# Patient Record
Sex: Female | Born: 1940 | Race: White | Hispanic: No | Smoking: Current some day smoker
Health system: Southern US, Community
[De-identification: ages and names within clinical notes are randomized; demographics above are authoritative.]

## PROBLEM LIST (undated history)

## (undated) DIAGNOSIS — F32A Depression, unspecified: Secondary | ICD-10-CM

## (undated) DIAGNOSIS — D649 Anemia, unspecified: Secondary | ICD-10-CM

## (undated) DIAGNOSIS — C55 Malignant neoplasm of uterus, part unspecified: Secondary | ICD-10-CM

## (undated) DIAGNOSIS — E119 Type 2 diabetes mellitus without complications: Secondary | ICD-10-CM

## (undated) DIAGNOSIS — E785 Hyperlipidemia, unspecified: Secondary | ICD-10-CM

## (undated) DIAGNOSIS — F329 Major depressive disorder, single episode, unspecified: Secondary | ICD-10-CM

## (undated) HISTORY — DX: Hyperlipidemia, unspecified: E78.5

## (undated) HISTORY — PX: ABDOMINAL HYSTERECTOMY: SHX81

## (undated) HISTORY — DX: Malignant neoplasm of uterus, part unspecified: C55

## (undated) HISTORY — PX: COLONOSCOPY: SHX174

## (undated) HISTORY — PX: APPENDECTOMY: SHX54

## (undated) HISTORY — DX: Type 2 diabetes mellitus without complications: E11.9

## (undated) HISTORY — DX: Anemia, unspecified: D64.9

## (undated) HISTORY — PX: GALLBLADDER SURGERY: SHX652

## (undated) HISTORY — DX: Depression, unspecified: F32.A

---
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
## 2019-01-30 ENCOUNTER — Other Ambulatory Visit: Payer: Self-pay

## 2019-01-30 ENCOUNTER — Emergency Department (HOSPITAL_COMMUNITY)
Admission: EM | Admit: 2019-01-30 | Discharge: 2019-01-30 | Disposition: A | Discharge status: Home / Self Care | Payer: Self-pay | Attending: Emergency Medicine | Admitting: Emergency Medicine

## 2019-01-30 ENCOUNTER — Observation Stay (HOSPITAL_COMMUNITY)
Admission: EM | Admit: 2019-01-30 | Discharge: 2019-01-31 | Disposition: A | Discharge status: Home / Self Care | Payer: Medicare Other | Attending: Internal Medicine | Admitting: Internal Medicine

## 2019-01-30 ENCOUNTER — Emergency Department (HOSPITAL_COMMUNITY): Payer: Medicare Other

## 2019-01-30 DIAGNOSIS — Z9114 Patient's other noncompliance with medication regimen: Secondary | ICD-10-CM | POA: Insufficient documentation

## 2019-01-30 DIAGNOSIS — Z20822 Contact with and (suspected) exposure to covid-19: Secondary | ICD-10-CM | POA: Diagnosis not present

## 2019-01-30 DIAGNOSIS — D649 Anemia, unspecified: Secondary | ICD-10-CM | POA: Diagnosis present

## 2019-01-30 DIAGNOSIS — D500 Iron deficiency anemia secondary to blood loss (chronic): Secondary | ICD-10-CM

## 2019-01-30 DIAGNOSIS — Z880 Allergy status to penicillin: Secondary | ICD-10-CM | POA: Diagnosis not present

## 2019-01-30 DIAGNOSIS — Z7982 Long term (current) use of aspirin: Secondary | ICD-10-CM | POA: Diagnosis not present

## 2019-01-30 DIAGNOSIS — I493 Ventricular premature depolarization: Secondary | ICD-10-CM | POA: Insufficient documentation

## 2019-01-30 DIAGNOSIS — Z7984 Long term (current) use of oral hypoglycemic drugs: Secondary | ICD-10-CM | POA: Diagnosis not present

## 2019-01-30 DIAGNOSIS — E1165 Type 2 diabetes mellitus with hyperglycemia: Secondary | ICD-10-CM | POA: Diagnosis not present

## 2019-01-30 DIAGNOSIS — R739 Hyperglycemia, unspecified: Secondary | ICD-10-CM | POA: Diagnosis present

## 2019-01-30 DIAGNOSIS — Z8542 Personal history of malignant neoplasm of other parts of uterus: Secondary | ICD-10-CM | POA: Diagnosis not present

## 2019-01-30 DIAGNOSIS — I498 Other specified cardiac arrhythmias: Secondary | ICD-10-CM | POA: Insufficient documentation

## 2019-01-30 DIAGNOSIS — F329 Major depressive disorder, single episode, unspecified: Secondary | ICD-10-CM | POA: Insufficient documentation

## 2019-01-30 DIAGNOSIS — D509 Iron deficiency anemia, unspecified: Principal | ICD-10-CM | POA: Insufficient documentation

## 2019-01-30 DIAGNOSIS — Z87891 Personal history of nicotine dependence: Secondary | ICD-10-CM | POA: Diagnosis not present

## 2019-01-30 DIAGNOSIS — R634 Abnormal weight loss: Secondary | ICD-10-CM

## 2019-01-30 DIAGNOSIS — Z5321 Procedure and treatment not carried out due to patient leaving prior to being seen by health care provider: Secondary | ICD-10-CM | POA: Insufficient documentation

## 2019-01-30 DIAGNOSIS — N179 Acute kidney failure, unspecified: Secondary | ICD-10-CM | POA: Diagnosis present

## 2019-01-30 LAB — GLUCOSE, CAPILLARY
Glucose-Capillary: 501 mg/dL (ref 70–99)
Glucose-Capillary: 251 mg/dL — ABNORMAL HIGH (ref 70–99)

## 2019-01-30 LAB — RETICULOCYTES
Immature Retic Fract: 23.5 % — ABNORMAL HIGH (ref 2.3–15.9)
RBC.: 3.09 MIL/uL — ABNORMAL LOW (ref 3.87–5.11)
Retic Count, Absolute: 98.9 10*3/uL (ref 19.0–186.0)
Retic Ct Pct: 3.2 % — ABNORMAL HIGH (ref 0.4–3.1)

## 2019-01-30 LAB — FERRITIN: Ferritin: 6 ng/mL — ABNORMAL LOW (ref 11–307)

## 2019-01-30 LAB — COMPREHENSIVE METABOLIC PANEL
ALT: 13 U/L (ref 0–44)
AST: 17 U/L (ref 15–41)
Albumin: 3.2 g/dL — ABNORMAL LOW (ref 3.5–5.0)
Alkaline Phosphatase: 63 U/L (ref 38–126)
Anion gap: 10 (ref 5–15)
BUN: 15 mg/dL (ref 8–23)
CO2: 23 mmol/L (ref 22–32)
Calcium: 9.2 mg/dL (ref 8.9–10.3)
Chloride: 100 mmol/L (ref 98–111)
Creatinine, Ser: 1.29 mg/dL — ABNORMAL HIGH (ref 0.44–1.00)
GFR calc Af Amer: 46 mL/min — ABNORMAL LOW (ref >60)
GFR calc non Af Amer: 40 mL/min — ABNORMAL LOW (ref >60)
Glucose, Bld: 421 mg/dL — ABNORMAL HIGH (ref 70–99)
Potassium: 4.6 mmol/L (ref 3.5–5.1)
Sodium: 133 mmol/L — ABNORMAL LOW (ref 135–145)
Total Bilirubin: 0.6 mg/dL (ref 0.3–1.2)
Total Protein: 6.2 g/dL — ABNORMAL LOW (ref 6.5–8.1)

## 2019-01-30 LAB — URINALYSIS, ROUTINE W REFLEX MICROSCOPIC
Bilirubin Urine: NEGATIVE
Glucose, UA: >=500 mg/dL — AB
Ketones, ur: 5 mg/dL — AB
Nitrite: NEGATIVE
Protein, ur: NEGATIVE mg/dL
Specific Gravity, Urine: 1.011 (ref 1.005–1.030)
pH: 5 (ref 5.0–8.0)

## 2019-01-30 LAB — PREPARE RBC (CROSSMATCH)

## 2019-01-30 LAB — HEMOGLOBIN A1C
Hgb A1c MFr Bld: 16.7 % — ABNORMAL HIGH (ref 4.8–5.6)
Mean Plasma Glucose: 432.59 mg/dL

## 2019-01-30 LAB — POC OCCULT BLOOD, ED: Fecal Occult Bld: NEGATIVE

## 2019-01-30 LAB — CBC
HCT: 26 % — ABNORMAL LOW (ref 36.0–46.0)
Hemoglobin: 7.2 g/dL — ABNORMAL LOW (ref 12.0–15.0)
MCH: 21.9 pg — ABNORMAL LOW (ref 26.0–34.0)
MCHC: 27.7 g/dL — ABNORMAL LOW (ref 30.0–36.0)
MCV: 79 fL — ABNORMAL LOW (ref 80.0–100.0)
Platelets: 287 10*3/uL (ref 150–400)
RBC: 3.29 MIL/uL — ABNORMAL LOW (ref 3.87–5.11)
RDW: 15.5 % (ref 11.5–15.5)
WBC: 7.5 10*3/uL (ref 4.0–10.5)
nRBC: 0 % (ref 0.0–0.2)

## 2019-01-30 LAB — FOLATE: Folate: 16.9 ng/mL (ref >5.9)

## 2019-01-30 LAB — CBG MONITORING, ED: Glucose-Capillary: 400 mg/dL — ABNORMAL HIGH (ref 70–99)

## 2019-01-30 LAB — ABO/RH: ABO/RH(D): O POS

## 2019-01-30 LAB — IRON AND TIBC
Iron: 19 ug/dL — ABNORMAL LOW (ref 28–170)
Saturation Ratios: 4 % — ABNORMAL LOW (ref 10.4–31.8)
TIBC: 430 ug/dL (ref 250–450)
UIBC: 411 ug/dL

## 2019-01-30 LAB — SARS CORONAVIRUS 2 (TAT 6-24 HRS): SARS Coronavirus 2: NEGATIVE

## 2019-01-30 LAB — VITAMIN B12: Vitamin B-12: 3244 pg/mL — ABNORMAL HIGH (ref 180–914)

## 2019-01-30 MED ORDER — LACTATED RINGERS IV BOLUS
1000.0000 mL | Freq: Once | INTRAVENOUS | Status: AC
  Administered 2019-01-30: 23:00:00 1000 mL via INTRAVENOUS

## 2019-01-30 MED ORDER — INSULIN ASPART 100 UNIT/ML ~~LOC~~ SOLN
0.0000 [IU] | Freq: Every day | SUBCUTANEOUS | Status: DC
  Administered 2019-01-30: 3 [IU] via SUBCUTANEOUS

## 2019-01-30 MED ORDER — INSULIN ASPART 100 UNIT/ML ~~LOC~~ SOLN: 18.0000 [IU] | Freq: Once | SUBCUTANEOUS | Status: DC

## 2019-01-30 MED ORDER — INSULIN ASPART 100 UNIT/ML ~~LOC~~ SOLN: 2.0000 [IU] | SUBCUTANEOUS | Status: DC

## 2019-01-30 MED ORDER — INSULIN ASPART 100 UNIT/ML ~~LOC~~ SOLN
0.0000 [IU] | Freq: Three times a day (TID) | SUBCUTANEOUS | Status: DC
  Administered 2019-01-30 – 2019-01-31 (×2): 15 [IU] via SUBCUTANEOUS
  Administered 2019-01-31: 3 [IU] via SUBCUTANEOUS

## 2019-01-30 MED ORDER — SULFAMETHOXAZOLE-TRIMETHOPRIM 800-160 MG PO TABS
1.0000 | ORAL_TABLET | Freq: Once | ORAL | Status: AC
  Administered 2019-01-30: 1 via ORAL
  Filled 2019-01-30: qty 1

## 2019-01-30 MED ORDER — INSULIN ASPART 100 UNIT/ML ~~LOC~~ SOLN
4.0000 [IU] | Freq: Once | SUBCUTANEOUS | Status: AC
  Administered 2019-01-30: 4 [IU] via INTRAVENOUS

## 2019-01-30 MED ORDER — SODIUM CHLORIDE 0.9 % IV BOLUS
500.0000 mL | Freq: Once | INTRAVENOUS | Status: AC
  Administered 2019-01-30: 500 mL via INTRAVENOUS

## 2019-01-30 MED ORDER — SODIUM CHLORIDE 0.9 % IV SOLN
10.0000 mL/h | Freq: Once | INTRAVENOUS | Status: AC
  Administered 2019-01-30: 10 mL/h via INTRAVENOUS

## 2019-01-30 MED ORDER — INSULIN GLARGINE 100 UNIT/ML ~~LOC~~ SOLN
12.0000 [IU] | Freq: Every day | SUBCUTANEOUS | Status: DC
  Administered 2019-01-30: 12 [IU] via SUBCUTANEOUS
  Filled 2019-01-30 (×3): qty 0.12

## 2019-01-30 MED ORDER — INSULIN ASPART 100 UNIT/ML ~~LOC~~ SOLN
3.0000 [IU] | Freq: Once | SUBCUTANEOUS | Status: AC
  Administered 2019-01-30: 3 [IU] via SUBCUTANEOUS

## 2019-01-30 NOTE — ED Notes (Signed)
Report given to 5N 

## 2019-01-30 NOTE — Consult Note (Addendum)
[facility] Consult: 4:42 PM 01/30/2019  LOS: 0 days    Referring Provider: Dr Rubye Oaks in ED  Primary Care Physician:  DR "Jenetta Downer" at [facility] in [place].   Primary Gastroenterologist:  unassigned     Reason for Consultation: Microcytic anemia.   HPI: Laura Winters is a 79 y.o. female.  PMH unknown.     Comes to the ED and there is no past medical history or meds.  Arrives with no paperwork..  Patient has had a laparoscopic cholecystectomy, abdominal hysterectomy.  She had colonoscopy in [place] approximately 10 years ago and does not recall having had polyps at the time.  For the last 7 years she has been living in [place] but in September moved to [place] is living in an assisted living facility..  Patient was sent by her family doctor to the hospital to receive blood transfusion.  She had just established care with PCP yesterday and had labs drawn with apparent Hb of 7.  Patient has never had blood transfusion and had a remote history of anemia.  She has had weakness, depression, diminished appetite and 23 pound weight loss in the last few months.  She quit smoking her half pack cigarettes/day about 3 weeks ago. Patient denies black stools, bloody stools.  She does periodically have eruptions of what sounds like sebaceous cysts in the perirectal area.  He denies nausea, abdominal or rectal pain.  Has not had any rectal bleeding or blood in her stools.  No melenic stools.  Uses daily low-dose aspirin, OTC ibuprofen maybe 6 times a month.  No alcohol. On DRE the stool is soft, yellow and FOBT negative  Hb 7.2, MCV 79.  Iron is low at 19, ferritin is 6. Sodium 133.  Glucose 421.  BUN/creatinine 15/1.2 consistent with stage III CKD. Portable chest x-ray unremarkable.  No past medical history on  file.  The histories are not reviewed yet. Please review them in the "History" navigator section and refresh this [place].  Prior to Admission medications   Not on File    Scheduled Meds: . insulin aspart  0-15 Units Subcutaneous TID WC  . insulin aspart  0-5 Units Subcutaneous QHS  . insulin glargine  12 Units Subcutaneous Daily   Infusions:  PRN Meds:    Allergies as of 01/30/2019  . (Not on File)    No family history on file.  Social History   Socioeconomic History  . Marital status: Widowed    Spouse name: Not on file  . Number of children: Not on file  . Years of education: Not on file  . Highest education level: Not on file  Occupational History  . Not on file  Tobacco Use  . Smoking status: Not on file  Substance and Sexual Activity  . Alcohol use: Not on file  . Drug use: Not on file  . Sexual activity: Not on file  Other Topics Concern  . Not on file  Social History Narrative  . Not on file   Social Determinants of Health   Financial Resource  Strain:   . Difficulty of Paying Living Expenses: Not on file  Food Insecurity:   . Worried About Charity fundraiser in the Last Year: Not on file  . Ran Out of Food in the Last Year: Not on file  Transportation Needs:   . Lack of Transportation (Medical): Not on file  . Lack of Transportation (Non-Medical): Not on file  Physical Activity:   . Days of Exercise per Week: Not on file  . Minutes of Exercise per Session: Not on file  Stress:   . Feeling of Stress : Not on file  Social Connections:   . Frequency of Communication with Friends and Family: Not on file  . Frequency of Social Gatherings with Friends and Family: Not on file  . Attends Religious Services: Not on file  . Active Member of Clubs or Organizations: Not on file  . Attends Archivist Meetings: Not on file  . Marital Status: Not on file  Intimate Partner Violence:   . Fear of Current or Ex-Partner: Not on file  . Emotionally  Abused: Not on file  . Physically Abused: Not on file  . Sexually Abused: Not on file    REVIEW OF SYSTEMS: Constitutional: Denies, fatigue. ENT:  No nose bleeds Pulm: Shortness of breath with moderate exertion. CV:  No angina.  Some tachycardia with moderate exertion.  No LE edema.  GU:  No hematuria, no frequency GI: See HPI. Heme: No unusual eating or bruising. Transfusions: No previous transfusions. Neuro:  No headaches, no peripheral tingling or numbness.  No syncope.  No seizures. Derm: Periodic "almond"-like eruptions on her rectum which drain a white cheesy substance. Endocrine:  No sweats or chills.  No polyuria or dysuria Immunization: Not queried. Travel: Moved to [place] from [place] 4 months ago.   PHYSICAL EXAM: Vital signs in last 24 hours: Vitals:   01/30/19 1530 01/30/19 1600  BP: 111/64   Pulse:  79  Resp: 14   Temp:  98 F (36.7 C)  SpO2:  98%   Wt Readings from Last 3 Encounters:  No data found for Wt    General: Patient is alert, pleasant, cooperative.  Does not look ill. Head: No facial asymmetry or swelling.  No signs of head trauma. Eyes: No scleral icterus.  No conjunctival pallor. Ears: Not hard of hearing Nose: No congestion or discharge. Mouth: Oropharynx moist, pink, clear.  There are some vascular blebs at the tip of her tongue.  Tongue is midline.  Fair dentition. Neck: No JVD, no masses, no thyromegaly. Lungs: Clear bilaterally.  No labored breathing.  No cough. Heart: RRR.  No MRG.  S1, S2 present. Abdomen: Soft.  Not tender, not distended.  She has a transverse upper hernia versus diastases of the muscle and a lower midline ventral hernia.  Well-healed surgical scars.  No masses, no HSM, no bruits, no hernias..   Rectal: Did not repeat rectal exam.  Rectal tone normal.  Hemorrhoidal skin tags.  Light yellowish-brown stool in vault.  Small furuncle versus early abscess at right gluteal cleft draining purulent material and associated  with mild induration but no fluctuance.  I saw some small pores without any drainage on visual inspection. Musc/Skeltl: No joint redness, swelling or gross deformity Extremities: No CCE. Neurologic: Oriented x3.  Moves all 4 limbs without gross weakness.  No tremors. Skin: No telangiectasia, suspicious lesions, sores or rashes. Tattoos: None observed Nodes: No cervical adenopathy. Psych: Calm, pleasant, cooperative.  Intake/Output from previous  day: No intake/output data recorded. Intake/Output this shift: Total I/O In: 500 [IV Piggyback:500] Out: -   LAB RESULTS: Recent Labs    01/30/19 1044  WBC 7.5  HGB 7.2*  HCT 26.0*  PLT 287   BMET Lab Results  Component Value Date   NA 133 (L) 01/30/2019   K 4.6 01/30/2019   CL 100 01/30/2019   CO2 23 01/30/2019   GLUCOSE 421 (H) 01/30/2019   BUN 15 01/30/2019   CREATININE 1.29 (H) 01/30/2019   CALCIUM 9.2 01/30/2019   LFT Recent Labs    01/30/19 1044  PROT 6.2*  ALBUMIN 3.2*  AST 17  ALT 13  ALKPHOS 63  BILITOT 0.6   PT/INR No results found for: INR, PROTIME Hepatitis Panel No results for input(s): HEPBSAG, HCVAB, HEPAIGM, HEPBIGM in the last 72 hours. C-Diff No components found for: CDIFF Lipase  No results found for: LIPASE  Drugs of Abuse  No results found for: LABOPIA, COCAINSCRNUR, LABBENZ, AMPHETMU, THCU, LABBARB   RADIOLOGY STUDIES: DG Chest Portable 1 View  Result Date: 01/30/2019 CLINICAL DATA:  Anemia and weakness. EXAM: PORTABLE CHEST 1 VIEW COMPARISON:  None. FINDINGS: The heart size and mediastinal contours are within normal limits. There is no evidence of pulmonary edema, consolidation, pneumothorax, nodule or pleural fluid. The visualized skeletal structures are unremarkable. IMPRESSION: No active disease. Electronically Signed   By: Aletta Edouard M.D.   On: 01/30/2019 12:59     IMPRESSION:   *    iron deficiency anemia.  FOBT negative.  Patient denies history of bleeding per rectum, melena  or history of ulcer disease. Remote, apparently unremarkable colonoscopy, no records found. Completed 1/2 PRBCs.    *    Covid 19 testing negative.     PLAN:     *    For Dr. [name].  I suspect she is going to end up having EGD, colonoscopy.   Azucena Freed  01/30/2019, 4:42 PM Phone [phone]  GI ATTENDING  History and data reviewed. Agree with assessment as outlined. Agree with transfusion for symptomatic iron deficiency anemia. Will need endoscopic workup at some point to be determined. No bleeding. Stool heme negative. Weight loss concerning. Will follow.   Docia Chuck. Geri Seminole., M.D. [facility] Division of Gastroenterology

## 2019-01-30 NOTE — ED Notes (Signed)
Pt entered into system incorrectly.

## 2019-01-30 NOTE — H&P (Signed)
Date: 01/30/2019               Patient Name:  Laura Winters MRN: [id number]  DOB: 02-11-1940 Age / Sex: 79 y.o., female   PCP: Patient, No Pcp Per         Medical Service: Internal Medicine Teaching Service         Attending Physician: Dr. Aldine Contes, MD    First Contact: Dr. Madilyn Fireman Pager: [phone]  Second Contact: Dr. Eileen Stanford Pager: [phone]       After Hours (After 5p/  First Contact Pager: [phone]  weekends / holidays): Second Contact Pager: [phone]   Chief Complaint: Fatigue, "I came for blood transfusion"  History of Present Illness: Laura Winters is a 79 yo F presenting after her Hgb was found to be 7 by her PCP in setting of fatigue and weakness for the last few months. She reports that she has been feeling "wobbly" and has no energy. She just wants to lay in bed and sleep. She reports improvement in her fatigue when she has activities to do like go to lunch with a friend. She recently moved in the area from [place] to be closer to family and it has been hard leaving friend behind and being in a retirement community that keeps people separated. Per her son, she established with a PCP because she ran out of her diabetes medications and upon doing so it was found that her glucose was incredibly elevated and her Hgb was low. The patient also endorses a 20 pound unintentional weight loss without any change in her appetite. She denies rhinorrhea, sore throat, chest pain, shortness of breath, palpitation, BRBPR, hematochezia and melena. She reports that she has had depression for years and cried in the emergency department because she wants to go home. She receives great care from her son who is making arrangements for her return home. She declined to speak with [place] further secondary to the pain from her IV.   Medical hx: DM Uterine cancer (around 30 years ago) treated with surgery Depression Reports a colonoscopy a few years ago that was normal Says she does not regularly get  mammograms Does not get low dose CT scans for lung cancer screening She declined to speak with [place] further secondary to the pain from her IV.   Meds:  No outpatient medications have been marked as taking for the 01/30/19 encounter [facility] Encounter).  Glipizide and metofrmin for diabet4s Incomplete medication hx obtained as patient declined to speak with [place] further  Allergies: Allergies as of 01/30/2019  . (Not on File)   No past medical history on file.  Family History:  No family history on file. Father had depression Mother was healthy Incomplete family medical hx obtained as patient declined to speak with [place] further  Social History:  Social History   Socioeconomic History  . Marital status: Widowed    Spouse name: Not on file  . Number of children: Not on file  . Years of education: Not on file  . Highest education level: Not on file  Occupational History  . Not on file  Tobacco Use  . Smoking status: Not on file  Substance and Sexual Activity  . Alcohol use: Not on file  . Drug use: Not on file  . Sexual activity: Not on file  Other Topics Concern  . Not on file  Social History Narrative  . Not on file   Social Determinants of Health   Financial Resource  Strain:   . Difficulty of Paying Living Expenses: Not on file  Food Insecurity:   . Worried About Charity fundraiser in the Last Year: Not on file  . Ran Out of Food in the Last Year: Not on file  Transportation Needs:   . Lack of Transportation (Medical): Not on file  . Lack of Transportation (Non-Medical): Not on file  Physical Activity:   . Days of Exercise per Week: Not on file  . Minutes of Exercise per Session: Not on file  Stress:   . Feeling of Stress : Not on file  Social Connections:   . Frequency of Communication with Friends and Family: Not on file  . Frequency of Social Gatherings with Friends and Family: Not on file  . Attends Religious Services: Not on file  . Active Member of Clubs or  Organizations: Not on file  . Attends Archivist Meetings: Not on file  . Marital Status: Not on file  Intimate Partner Violence:   . Fear of Current or Ex-Partner: Not on file  . Emotionally Abused: Not on file  . Physically Abused: Not on file  . Sexually Abused: Not on file  Patient has 60 pack year hx and reports quitting a few weeks ago She occasionally drinks sangria Incomplete social hx obtained as patient declined to speak with [place] further.   Review of Systems: A complete ROS was negative except as per HPI.   Physical Exam: Blood pressure 111/64, pulse 79, temperature 98 F (36.7 C), temperature source Oral, resp. rate 14, SpO2 98 %.  Physical Exam  Constitutional: She is well-developed, well-nourished, and in no distress. No distress.  HENT:  Head: Normocephalic and atraumatic.  Cardiovascular: Regular rhythm and normal heart sounds.  Pulmonary/Chest: Effort normal and breath sounds normal. No respiratory distress.  Abdominal: Soft. Bowel sounds are normal. She exhibits no distension.  Musculoskeletal:        General: Normal range of motion.     Cervical back: Normal range of motion.  Neurological: She is alert.  Skin: Skin is warm and dry. She is not diaphoretic.  Psychiatric:  irritable  Nursing note and vitals reviewed.  Labs:  Results for orders placed or performed during the hospital encounter of 01/30/19 (from the past 24 hour(s))  Type and screen [place]     Status: None (Preliminary result)   Collection Time: 01/30/19 10:35 AM  Result Value Ref Range   ABO/RH(D) O POS    Antibody Screen NEG    Sample Expiration 02/02/2019,2359    Unit Number [id number]    Blood Component Type RED CELLS,LR    Unit division 00    Status of Unit ALLOCATED    Transfusion Status OK TO TRANSFUSE    Crossmatch Result Compatible    Unit Number [id number]    Blood Component Type RED CELLS,LR    Unit division 00    Status of Unit ISSUED      Transfusion Status OK TO TRANSFUSE    Crossmatch Result      Compatible Performed at [facility], [address], [place], [place] [address]   ABO/Rh     Status: None   Collection Time: 01/30/19 10:35 AM  Result Value Ref Range   ABO/RH(D)      O POS Performed at [facility] [address], [place], [place] [address]   Prepare RBC     Status: None   Collection Time: 01/30/19 10:35  AM  Result Value Ref Range   Order Confirmation      ORDER PROCESSED BY BLOOD BANK Performed at [facility], [place] [address], [place], [place] [address]   Comprehensive metabolic panel     Status: Abnormal   Collection Time: 01/30/19 10:44 AM  Result Value Ref Range   Sodium 133 (L) 135 - 145 mmol/L   Potassium 4.6 3.5 - 5.1 mmol/L   Chloride 100 98 - 111 mmol/L   CO2 23 22 - 32 mmol/L   Glucose, Bld 421 (H) 70 - 99 mg/dL   BUN 15 8 - 23 mg/dL   Creatinine, Ser 1.29 (H) 0.44 - 1.00 mg/dL   Calcium 9.2 8.9 - 10.3 mg/dL   Total Protein 6.2 (L) 6.5 - 8.1 g/dL   Albumin 3.2 (L) 3.5 - 5.0 g/dL   AST 17 15 - 41 U/L   ALT 13 0 - 44 U/L   Alkaline Phosphatase 63 38 - 126 U/L   Total Bilirubin 0.6 0.3 - 1.2 mg/dL   GFR calc non Af Amer 40 (L) >60 mL/min   GFR calc Af Amer 46 (L) >60 mL/min   Anion gap 10 5 - 15  CBC     Status: Abnormal   Collection Time: 01/30/19 10:44 AM  Result Value Ref Range   WBC 7.5 4.0 - 10.5 K/uL   RBC 3.29 (L) 3.87 - 5.11 MIL/uL   Hemoglobin 7.2 (L) 12.0 - 15.0 g/dL   HCT 26.0 (L) 36.0 - 46.0 %   MCV 79.0 (L) 80.0 - 100.0 fL   MCH 21.9 (L) 26.0 - 34.0 pg   MCHC 27.7 (L) 30.0 - 36.0 g/dL   RDW 15.5 11.5 - 15.5 %   Platelets 287 150 - 400 K/uL   nRBC 0.0 0.0 - 0.2 %  POC occult blood, ED     Status: None   Collection Time: 01/30/19 12:00 PM  Result Value Ref Range   Fecal Occult Bld NEGATIVE NEGATIVE  SARS CORONAVIRUS 2 (TAT 6-24 HRS) Nasopharyngeal Nasopharyngeal Swab     Status: None   Collection Time: 01/30/19 12:42 PM   Specimen:  Nasopharyngeal Swab  Result Value Ref Range   SARS Coronavirus 2 NEGATIVE NEGATIVE  Vitamin B12     Status: Abnormal   Collection Time: 01/30/19 12:42 PM  Result Value Ref Range   Vitamin B-12 3,244 (H) 180 - 914 pg/mL  Iron and TIBC     Status: Abnormal   Collection Time: 01/30/19 12:42 PM  Result Value Ref Range   Iron 19 (L) 28 - 170 ug/dL   TIBC 430 250 - 450 ug/dL   Saturation Ratios 4 (L) 10.4 - 31.8 %   UIBC 411 ug/dL  Ferritin     Status: Abnormal   Collection Time: 01/30/19 12:42 PM  Result Value Ref Range   Ferritin 6 (L) 11 - 307 ng/mL  Reticulocytes     Status: Abnormal   Collection Time: 01/30/19 12:42 PM  Result Value Ref Range   Retic Ct Pct 3.2 (H) 0.4 - 3.1 %   RBC. 3.09 (L) 3.87 - 5.11 MIL/uL   Retic Count, Absolute 98.9 19.0 - 186.0 K/uL   Immature Retic Fract 23.5 (H) 2.3 - 15.9 %  Folate     Status: None   Collection Time: 01/30/19 12:42 PM  Result Value Ref Range   Folate 16.9 >5.9 ng/mL  Hemoglobin A1c     Status: Abnormal   Collection Time: 01/30/19 12:42  PM  Result Value Ref Range   Hgb A1c MFr Bld 16.7 (H) 4.8 - 5.6 %   Mean Plasma Glucose 432.59 mg/dL  CBG monitoring, ED     Status: Abnormal   Collection Time: 01/30/19  1:12 PM  Result Value Ref Range   Glucose-Capillary 400 (H) 70 - 99 mg/dL  Urinalysis, Routine w reflex microscopic     Status: Abnormal   Collection Time: 01/30/19  4:12 PM  Result Value Ref Range   Color, Urine STRAW (A) YELLOW   APPearance HAZY (A) CLEAR   Specific Gravity, Urine 1.011 1.005 - 1.030   pH 5.0 5.0 - 8.0   Glucose, UA >=500 (A) NEGATIVE mg/dL   Hgb urine dipstick SMALL (A) NEGATIVE   Bilirubin Urine NEGATIVE NEGATIVE   Ketones, ur 5 (A) NEGATIVE mg/dL   Protein, ur NEGATIVE NEGATIVE mg/dL   Nitrite NEGATIVE NEGATIVE   Leukocytes,Ua LARGE (A) NEGATIVE   RBC / HPF 0-5 0 - 5 RBC/hpf   WBC, UA 6-10 0 - 5 WBC/hpf   Bacteria, UA RARE (A) NONE SEEN   Squamous Epithelial / LPF 0-5 0 - 5   Mucus PRESENT      EKG: personally reviewed my interpretation is sinus tach.  CXR: personally reviewed my interpretation is no acute cardiopulmonary abnormality.  DG Chest Portable 1 View  Result Date: 01/30/2019 CLINICAL DATA:  Anemia and weakness. EXAM: PORTABLE CHEST 1 VIEW COMPARISON:  None. FINDINGS: The heart size and mediastinal contours are within normal limits. There is no evidence of pulmonary edema, consolidation, pneumothorax, nodule or pleural fluid. The visualized skeletal structures are unremarkable. IMPRESSION: No active disease. Electronically Signed   By: Aletta Edouard M.D.   On: 01/30/2019 12:59    Assessment:  Ms. Calender is a 79 yo F w/ a PMHx significant for DM2, depression and a distant uterine cancer s/p TAH presenting with fatigue, weakness, ?rectal bleeding and a 20 lb weight loss who was recently found to have a glucose in the 600s and a Hgb of 7 at her PCP's office and was told to come to the ED for a blood transfusion.   Plan by Problem:   Fatigue/Weakness/Symptomatic Anemia: -pt with a PMHx significant for DM2, depression and a distant uterine cancer s/p TAH presenting with 2-3 months of worsening fatigue and weakness with associated 20 lb weight loss who was found to have a glucose of 600 and a Hgb of 7 at her PCP's office -patient was treated with insulin and fluids and glucose came down but patient was told to present to the ED for blood transfusion  -patient's son says patient has been complaining of BRBPR and rectal pain although patient denies this in HPI -pt Hgb is 7.2 in the ED and iron studies show iron deficiency -POC occult blood negative -rectal exam by ED physician without obvious blood -pt reports a hx of a colonoscopy a few years ago which was normal; son doubts patient's reliability as a historian  -sx could be symptomatic anemia vs secondary to her uncontrolled diabetes or her chronic depression  -given the red flag sx of weight loss, consulting GI for concern  about a colorectal cancer -pt getting blood transfusion x 1  Plan: -fu GI recs -fu post transfusion CBC -treat diabetes as below -treat depression as below -obtain outside records  Uncontrolled Diabetes: -pt with hx of diabetes on glipizide and metformin -has had recent fatigue, weakness and 20 lb weight loss -recently found to have glucose in the  600s by her new PCP which is likely not acute given her lack of acute sx and hx per son regarding not regularly taking her diabetes medications -Hgb A1c here > 16 -received 4 U novolog in the ED  Plan: -obtain outside records -starting 12 units long acting today w/ SSI -carb modified diet  Depression: -pt with hx of depression per son -he states she is not always adherent to her depression medication regimen -no meds reported by patient  Plan: -obtain outside records -fu with son regarding patient's medications and restart patient on home depression meds as appropriate   AKI: -pt with no reported hx of CKD with admission Creatinine of 1.29 -no records in Epic or records in Care Everywhere -patient does have uncontrolled DM   Plan: -monitor with daily BMP -obtain outside records  Active Problems:   Symptomatic anemia  Dispo: Admit patient to Observation with expected length of stay less than 2 midnights.  Signed: Al Decant, MD 01/30/2019, 4:58 PM  Pager: [phone]

## 2019-01-30 NOTE — ED Notes (Signed)
Advised RN on 5N that pt has been given one unit PRBC and needs the second.  ALso advised that COVID is pending (pt has no symptoms)

## 2019-01-30 NOTE — ED Notes (Signed)
Carb modified tray ordered 

## 2019-01-30 NOTE — Progress Notes (Signed)
CRITICAL VALUE ALERT  Critical Value:  cbg 501  Date & Time Notied:  01/30/2019 at 1830  Provider Notified: Dareen Piano, nischal  Orders Received/Actions taken: stat cbc diff, given maximum coverage of insulin.

## 2019-01-30 NOTE — ED Notes (Signed)
Helped pt to bathroom where she voided and had a BM

## 2019-01-30 NOTE — ED Provider Notes (Signed)
[facility] EMERGENCY DEPARTMENT Provider Note   CSN: [id number] Arrival date & time: 01/30/19  1016     History Chief Complaint  Patient presents with   low hgb    Laura Winters is a 79 y.o. female with past medical history who presents for evaluation of low hemoglobin.  Patient states he recently moved here from [place] to be closer to family.  She lives in independent assisted living facility.  Patient states she has had generalized fatigue and weakness over the last week.  Went to establish care at a PCP office yesterday, [facility] and had labs drawn.  They called today with hemoglobin of 7.0.  Patient unsure if she has history of anemia.  She denies fever, chills, nausea, vomiting, chest pain, shortness of breath, abdominal pain, diarrhea, dysuria, last bowel movement 2 days ago, melena, bright red blood per rectum.  Patient states she does have a "pimple" on her buttocks.  Patient states she has had these previously which she normally "pops" and they resolved.  Rates her current pain a 1/10.  Has never had the surgically drained.  Denies additional aggravating or alleviating factors.   History obtained from patient past medical records.  No interpreter is used.  HPI        PCP- [facility]  Son murry citizen Contact    No past medical history on file.  There are no problems to display for this patient.  History obtained  OB History   No obstetric history on file.     No family history on file.  Social History   Tobacco Use   Smoking status: Not on file  Substance Use Topics   Alcohol use: Not on file   Drug use: Not on file    Home Medications Prior to Admission medications   Not on File    Allergies    Patient has no allergy information on record.  Review of Systems   Review of Systems  Constitutional: Positive for fatigue. Negative for activity change, appetite change, chills, diaphoresis, fever and unexpected weight  change.  HENT: Negative.   Respiratory: Negative.   Cardiovascular: Negative.   Gastrointestinal: Negative.   Genitourinary: Negative.   Musculoskeletal: Negative.   Skin: Negative.   Neurological: Positive for weakness (Generalized). Negative for dizziness, tremors, seizures, syncope, facial asymmetry, speech difficulty, light-headedness, numbness and headaches.  All other systems reviewed and are negative.   Physical Exam Updated Vital Signs BP (!) 148/84    Pulse (!) 126    Temp 98.8 F (37.1 C) (Oral)    Resp 18    SpO2 98%   Physical Exam Vitals and nursing note reviewed. Exam conducted with a chaperone present.  Constitutional:      General: She is not in acute distress.    Appearance: She is well-developed. She is not ill-appearing or toxic-appearing.     Comments: Thin, pleasant 79 year old  HENT:     Head: Normocephalic and atraumatic.     Jaw: There is normal jaw occlusion.     Mouth/Throat:     Lips: Pink.     Mouth: Mucous membranes are moist.  Eyes:     Pupils: Pupils are equal, round, and reactive to light.     Comments: EOM intact. Mild pallor to conjunctiva  Cardiovascular:     Rate and Rhythm: Normal rate.     Pulses: Normal pulses.     Heart sounds: Normal heart sounds.  Pulmonary:  Effort: Pulmonary effort is normal. No respiratory distress.     Breath sounds: Normal breath sounds and air entry.  Abdominal:     General: Bowel sounds are normal. There is no distension.     Palpations: There is no mass.     Tenderness: There is no abdominal tenderness. There is no right CVA tenderness, left CVA tenderness, guarding or rebound.     Hernia: No hernia is present.  Genitourinary:    Comments: Female tech in room during exam. Normal rectal tone. Old hemorrhoid skin tags. Light brown stool in vault. Small furuncle vs early abscess to right gluteal cleft. Does not extend into the rectum, rectal sphincter. Area draining prulet drainage. No surrounding  erythema, warmth, Mild induration without fluctuance Musculoskeletal:        General: Normal range of motion.     Cervical back: Normal range of motion.     Comments: Moves all 4 extremities without difficulty.  Skin:    General: Skin is warm and dry.  Neurological:     Mental Status: She is alert.     ED Results / Procedures / Treatments   Labs (all labs ordered are listed, but only abnormal results are displayed) Labs Reviewed  COMPREHENSIVE METABOLIC PANEL - Abnormal; Notable for the following components:      Result Value   Sodium 133 (*)    Glucose, Bld 421 (*)    Creatinine, Ser 1.29 (*)    Total Protein 6.2 (*)    Albumin 3.2 (*)    GFR calc non Af Amer 40 (*)    GFR calc Af Amer 46 (*)    All other components within normal limits  CBC - Abnormal; Notable for the following components:   RBC 3.29 (*)    Hemoglobin 7.2 (*)    HCT 26.0 (*)    MCV 79.0 (*)    MCH 21.9 (*)    MCHC 27.7 (*)    All other components within normal limits  SARS CORONAVIRUS 2 (TAT 6-24 HRS)  URINALYSIS, ROUTINE W REFLEX MICROSCOPIC  VITAMIN B12  FOLATE  IRON AND TIBC  FERRITIN  RETICULOCYTES  POC OCCULT BLOOD, ED  TYPE AND SCREEN  ABO/RH  PREPARE RBC (CROSSMATCH)    EKG None  Radiology No results found.  Procedures .Critical Care Performed by: Nettie Elm, PA-C Authorized by: Nettie Elm, PA-C   Critical care provider statement:    Critical care time (minutes):  45   Critical care was necessary to treat or prevent imminent or life-threatening deterioration of the following conditions:  Circulatory failure (Symptomatic anemia, AKI, hyperglycemia)   Critical care was time spent personally by me on the following activities:  Discussions with consultants, evaluation of patient's response to treatment, examination of patient, ordering and performing treatments and interventions, ordering and review of laboratory studies, ordering and review of radiographic studies,  pulse oximetry, re-evaluation of patient's condition, obtaining history from patient or surrogate and review of old charts   (including critical care time)  Medications Ordered in ED Medications  0.9 %  sodium chloride infusion (has no administration in time range)  insulin aspart (novoLOG) injection 2-6 Units (has no administration in time range)  sulfamethoxazole-trimethoprim (BACTRIM DS) 800-160 MG per tablet 1 tablet (1 tablet Oral Given 01/30/19 1229)  sodium chloride 0.9 % bolus 500 mL (500 mLs Intravenous New Bag/Given 01/30/19 1248)    ED Course  I have reviewed the triage vital signs and the nursing notes.  Pertinent  labs & imaging results that were available during my care of the patient were reviewed by me and considered in my medical decision making (see chart for details).  79 year old female presents for evaluation of low hemoglobin.  Patiently recently moved here from [place].  Went to establish care yesterday with a PCP.  Patient states she has had generalized fatigue and weakness x1 week.  Call today due to anemia.  Hemoglobin 7.0 at PCP office.  Patient denies any upper respiratory symptoms, chest pain, shortness of breath, hemoptysis, melena or bright red blood per rectum.  Heart and lungs clear.  Abdomen soft.  Light brown stool in rectal vault.  Occult negative.  Does have a small draining furuncle to right gluteal cleft.  No surrounding cellulitis.  We will give her Bactrim.  Area does not seem to track into rectum or anal sphincter.  Do not think we need to obtain CT abdomen at this time.  Have some pallor to her conjunctive a.  Unsure if she has prior history of anemia.  Labs obtained from triage.  Clinical Course as of Jan 30 1256  Fri Jan 30, 2019  1215 Hyponatremia at 133, hyperglycemia 421, patient has been out of her glipizide and Metformin x1 week.  Creatinine 1.29, no previous to compare.  Comprehensive metabolic panel(!) [BH]  99991111 Negative  POC occult blood, ED  [BH]  1230 No leukocytosis, Hgb 7.2, no prior to compare   CBC(!) [BH]    Clinical Course User Index [BH] Aastha Dayley A, PA-C   Will add on anemia panel urinalysis however patient denies any urinary complaints.  No known Covid exposures.  Patient will need admission for symptomatic anemia.  Hemoglobin 7.2 here.  No active bleeding on exam and she is not on any anticoagulation.  Discussedrisk versus benefit of blood transfusion.  Patient is agreeable.  Patient noted to be hyperglycemic on labs, she has been on for home glipizide, Metformin.  She is supposed to start insulin outpatient however she has not started this.  Hyperglycemia order set ordered with sliding scale. No evidence of DKA at this tim, normal gap. Will also give small bolus of fluids given she does have a creatinine of 1.29 and unsure of her baseline.  1257: CONSULT with Internal Medicine teaching service Resident who agrees to evaluate patient for admission, under attending Dr. Dareen Piano   Patient discussed with attending Dr. Rogene Houston who agrees with above treatment, plan and disposition. MDM Rules/Calculators/A&P                       Final Clinical Impression(s) / ED Diagnoses Final diagnoses:  Symptomatic anemia  Hyperglycemia  AKI (acute kidney injury) [facility])    Rx / DC Orders ED Discharge Orders    None       Erling Arrazola A, PA-C 01/30/19 1523    Fredia Sorrow, MD 02/01/19 [id number]

## 2019-01-30 NOTE — Progress Notes (Signed)
Pt new admit form ED alert and oriented, ambulatory, room air, covid collected but pending result, given 1 unit of prbc in ED and I'm giving 2nd unit of prbc verified by another RN with consent, will continue to monitor blood transfusion reaction.

## 2019-01-30 NOTE — Plan of Care (Signed)
Pt new admit Dx anemia 2nd unit of PRBC on going

## 2019-01-30 NOTE — ED Triage Notes (Signed)
Sent to ED for eval of hgb 7.0. Sent from pcp's office. Labs drawn there yesterday due to continued weakness. Pt denies pain, sob, or bleeding. Pt appears in nad.

## 2019-01-31 DIAGNOSIS — Z9889 Other specified postprocedural states: Secondary | ICD-10-CM

## 2019-01-31 DIAGNOSIS — N179 Acute kidney failure, unspecified: Secondary | ICD-10-CM

## 2019-01-31 DIAGNOSIS — R634 Abnormal weight loss: Secondary | ICD-10-CM | POA: Diagnosis not present

## 2019-01-31 DIAGNOSIS — R739 Hyperglycemia, unspecified: Secondary | ICD-10-CM

## 2019-01-31 DIAGNOSIS — E118 Type 2 diabetes mellitus with unspecified complications: Secondary | ICD-10-CM | POA: Diagnosis not present

## 2019-01-31 DIAGNOSIS — Z6826 Body mass index (BMI) 26.0-26.9, adult: Secondary | ICD-10-CM

## 2019-01-31 DIAGNOSIS — Z880 Allergy status to penicillin: Secondary | ICD-10-CM

## 2019-01-31 DIAGNOSIS — Z90710 Acquired absence of both cervix and uterus: Secondary | ICD-10-CM

## 2019-01-31 DIAGNOSIS — F329 Major depressive disorder, single episode, unspecified: Secondary | ICD-10-CM

## 2019-01-31 DIAGNOSIS — Z8542 Personal history of malignant neoplasm of other parts of uterus: Secondary | ICD-10-CM

## 2019-01-31 DIAGNOSIS — D508 Other iron deficiency anemias: Secondary | ICD-10-CM

## 2019-01-31 DIAGNOSIS — R7989 Other specified abnormal findings of blood chemistry: Secondary | ICD-10-CM | POA: Diagnosis not present

## 2019-01-31 DIAGNOSIS — Z7984 Long term (current) use of oral hypoglycemic drugs: Secondary | ICD-10-CM

## 2019-01-31 DIAGNOSIS — D500 Iron deficiency anemia secondary to blood loss (chronic): Secondary | ICD-10-CM | POA: Diagnosis not present

## 2019-01-31 LAB — CBC WITH DIFFERENTIAL/PLATELET
Abs Immature Granulocytes: 0.02 10*3/uL (ref 0.00–0.07)
Basophils Absolute: 0 10*3/uL (ref 0.0–0.1)
Basophils Relative: 1 %
Eosinophils Absolute: 0.2 10*3/uL (ref 0.0–0.5)
Eosinophils Relative: 3 %
HCT: 30.1 % — ABNORMAL LOW (ref 36.0–46.0)
Hemoglobin: 9.2 g/dL — ABNORMAL LOW (ref 12.0–15.0)
Immature Granulocytes: 0 %
Lymphocytes Relative: 24 %
Lymphs Abs: 1.4 10*3/uL (ref 0.7–4.0)
MCH: 24.4 pg — ABNORMAL LOW (ref 26.0–34.0)
MCHC: 30.6 g/dL (ref 30.0–36.0)
MCV: 79.8 fL — ABNORMAL LOW (ref 80.0–100.0)
Monocytes Absolute: 0.4 10*3/uL (ref 0.1–1.0)
Monocytes Relative: 7 %
Neutro Abs: 3.8 10*3/uL (ref 1.7–7.7)
Neutrophils Relative %: 65 %
Platelets: 190 10*3/uL (ref 150–400)
RBC: 3.77 MIL/uL — ABNORMAL LOW (ref 3.87–5.11)
RDW: 15.4 % (ref 11.5–15.5)
WBC: 5.9 10*3/uL (ref 4.0–10.5)
nRBC: 0 % (ref 0.0–0.2)

## 2019-01-31 LAB — BASIC METABOLIC PANEL
Anion gap: 8 (ref 5–15)
BUN: 19 mg/dL (ref 8–23)
CO2: 21 mmol/L — ABNORMAL LOW (ref 22–32)
Calcium: 8.5 mg/dL — ABNORMAL LOW (ref 8.9–10.3)
Chloride: 102 mmol/L (ref 98–111)
Creatinine, Ser: 1.66 mg/dL — ABNORMAL HIGH (ref 0.44–1.00)
GFR calc Af Amer: 34 mL/min — ABNORMAL LOW (ref >60)
GFR calc non Af Amer: 29 mL/min — ABNORMAL LOW (ref >60)
Glucose, Bld: 181 mg/dL — ABNORMAL HIGH (ref 70–99)
Potassium: 4.5 mmol/L (ref 3.5–5.1)
Sodium: 131 mmol/L — ABNORMAL LOW (ref 135–145)

## 2019-01-31 LAB — GLUCOSE, CAPILLARY
Glucose-Capillary: 369 mg/dL — ABNORMAL HIGH (ref 70–99)
Glucose-Capillary: 170 mg/dL — ABNORMAL HIGH (ref 70–99)

## 2019-01-31 MED ORDER — ACETAMINOPHEN 325 MG PO TABS
650.0000 mg | ORAL_TABLET | Freq: Four times a day (QID) | ORAL | Status: DC | PRN
  Administered 2019-01-31: 650 mg via ORAL
  Filled 2019-01-31: qty 2

## 2019-01-31 MED ORDER — FERROUS SULFATE 325 (65 FE) MG PO TABS: 325.0000 mg | ORAL_TABLET | Freq: Every day | ORAL | 3 refills | Status: AC

## 2019-01-31 MED ORDER — SODIUM CHLORIDE 0.9 % IV SOLN: INTRAVENOUS | Status: DC

## 2019-01-31 NOTE — Plan of Care (Signed)
  Problem: Pain Managment: Goal: General experience of comfort will improve Outcome: Progressing   Problem: Safety: Goal: Ability to remain free from injury will improve Outcome: Progressing   Problem: Skin Integrity: Goal: Risk for impaired skin integrity will decrease Outcome: Progressing   

## 2019-01-31 NOTE — Plan of Care (Signed)
Pt to d/c home all d/c instructions given and explained with understanding

## 2019-01-31 NOTE — Discharge Summary (Signed)
Name: Laura Winters MRN: [id number] DOB: 05/08/1940 79 y.o. PCP: Patient, No Pcp Per  Date of Admission: 01/30/2019 10:19 AM Date of Discharge: 01/31/2019 Attending Physician: No att. providers found  Discharge Diagnosis:  1. Iron deficiency anemia   2. Uncontrolled diabetes  Discharge Medications: Allergies as of 01/31/2019      Reactions   Penicillins Swelling, Rash   Did it involve swelling of the face/tongue/throat, SOB, or low BP? Yes Did it involve sudden or severe rash/hives, skin peeling, or any reaction on the inside of your mouth or nose? Yes Did you need to seek medical attention at a hospital or doctor's office? Yes When did it last happen?Childhood If all above answers are "NO", may proceed with cephalosporin use.      Medication List    TAKE these medications   aspirin EC 81 MG tablet Take 81 mg by mouth daily.   ferrous sulfate 325 (65 FE) MG tablet Take 1 tablet (325 mg total) by mouth daily.   glipiZIDE 10 MG tablet Commonly known as: GLUCOTROL Take 20 mg by mouth 2 (two) times daily.   lisinopril 20 MG tablet Commonly known as: ZESTRIL Take 20 mg by mouth daily.   metFORMIN 1000 MG tablet Commonly known as: GLUCOPHAGE Take 1,000 mg by mouth 2 (two) times daily.   sertraline 25 MG tablet Commonly known as: ZOLOFT Take 25 mg by mouth daily.   simvastatin 20 MG tablet Commonly known as: ZOCOR Take 20 mg by mouth every evening.   VITAMIN B12 PO Take 2 tablets by mouth daily.   VITAMIN D3 PO Take 1 tablet by mouth daily.       Disposition and follow-up:   [name] was discharged from [facility] in Good condition.  At the hospital follow up visit please address:   1.  F/u with GI for outpatient endoscopy/colonoscopy  2.  Repeat CBC with PCP   3.  Discuss starting insulin with PCP  Follow-up Appointments: Follow-up Information    [facility]. Schedule an appointment as soon as possible for  a visit in 1 week(s).   Contact information: Phone: [phone] Email: [email or web address]  [place], [place] [address]       Gastroenterology, [facility] Follow up in 1 week(s).   Contact information: [place] [address] [phone]           Gatroenterology  PCP  Hospital Course by problem list:  1. Fatigue/Weakness/Symptomatic Anemia: Patientpresenting with2-3 months of worsening fatigue and weakness with associated 20 lb weight loss who was found to have a Hgb of 7 at her PCP's office on 1/4. She was subsquently instructed to present to the ED where she received iron studies that reflected iron deficiency. POC occult blood was negative. She was treated with 2 units of pRBC's which improved her hemoglobin to 9.2. Of note, patent reports history of colonoscopy 10 years ago which was negative for polyps although son contests that this may not be reliable information. Patient to follow-up with GI for outpatient endoscopy/colonoscopy to identify possible source of bleeding.   2. Uncontrolled Diabetes: Patient with known history of diabetes on glipizide and metformin. Found to have blood sugar over 600 by PCP on 1/4. Likely not acute given her lack of acute symptoms and history per son regarding not regularly taking her diabetes medications. Hgb A1c = 16.7%. Blood sugars maintained with SSI as inpatient. Recommend starting insulin with PCP.   Discharge  Vitals:   BP 124/68 (BP Location: Left Arm)   Pulse 77   Temp 98.9 F (37.2 C) (Oral)   Resp 16   Ht 5' (1.524 m)   Wt 61.6 kg   SpO2 96%   BMI 26.52 kg/m   Pertinent Labs, Studies, and Procedures:   CBC Latest Ref Rng & Units 01/31/2019 01/30/2019  WBC 4.0 - 10.5 K/uL 5.9 7.5  Hemoglobin 12.0 - 15.0 g/dL 9.2(L) 7.2(L)  Hematocrit 36.0 - 46.0 % 30.1(L) 26.0(L)  Platelets 150 - 400 K/uL 190 287   CMP Latest Ref Rng & Units 01/31/2019 01/30/2019  Glucose 70 - 99 mg/dL  181(H) 421(H)  BUN 8 - 23 mg/dL 19 15  Creatinine 0.44 - 1.00 mg/dL 1.66(H) 1.29(H)  Sodium 135 - 145 mmol/L 131(L) 133(L)  Potassium 3.5 - 5.1 mmol/L 4.5 4.6  Chloride 98 - 111 mmol/L 102 100  CO2 22 - 32 mmol/L 21(L) 23  Calcium 8.9 - 10.3 mg/dL 8.5(L) 9.2  Total Protein 6.5 - 8.1 g/dL - 6.2(L)  Total Bilirubin 0.3 - 1.2 mg/dL - 0.6  Alkaline Phos 38 - 126 U/L - 63  AST 15 - 41 U/L - 17  ALT 0 - 44 U/L - 13   Iron/TIBC/Ferritin/ %Sat    Component Value Date/Time   IRON 19 (L) 01/30/2019 1242   TIBC 430 01/30/2019 1242   FERRITIN 6 (L) 01/30/2019 1242   IRONPCTSAT 4 (L) 01/30/2019 1242   Discharge Instructions:  Discharge Instructions    Activity as tolerated - No restrictions   Complete by: As directed    Diet Carb Modified   Complete by: As directed       Signed: Bebe Liter, [place] 01/31/2019, 11:29 AM  Attestation for Student Documentation:  I personally was present and performed or re-performed the history, physical exam and medical decision-making activities of this service and have verified that the service and findings are accurately documented in the student's note.  Al Decant, MD 02/06/2019, 6:55 AM

## 2019-01-31 NOTE — Discharge Instructions (Signed)
You were admitted to the hospital for low hemoglobin and elevated glucose in your blood. You received a blood transfusion that improved your hemoglobin and were treated with insulin for your glucose. We are concerned you may have a bleed in your GI tract. You were seen by our GI doctors who recommend a colonoscopy and/or endoscopy. You will have that done outpatient. Please also follow up with your primary care doctor within one week as you will need to start using insulin for your diabetes. Please also follow up with the GI doctors for a colonoscopy.

## 2019-01-31 NOTE — Progress Notes (Signed)
Subjective:  Evaluated patient at bedside during rounds this morning. Patient feels much improved after 2 units of pRBCs yesterday and denies any complaints at this time.   Objective:  Vital signs in last 24 hours: Vitals:   01/30/19 1721 01/30/19 1805 01/30/19 2229 01/31/19 0410  BP: (!) 145/76 129/60 (!) 96/52 103/68  Pulse: 89 80 70 63  Resp: 16 16 16 16   Temp: 98 F (36.7 C) (!) 97.3 F (36.3 C) 97.7 F (36.5 C) 98.8 F (37.1 C)  TempSrc:  Oral Oral Oral  SpO2: 98%  100% 99%  Weight: 61.6 kg     Height: 5' (1.524 m)      Weight change:   Intake/Output Summary (Last 24 hours) at 01/31/2019 0947 Last data filed at 01/31/2019 0940 Gross per 24 hour  Intake 1084 ml  Output 300 ml  Net 784 ml   Physical Exam  Constitutional: She is oriented to person, place, and time and well-developed, well-nourished, and in no distress. No distress.  HENT:  Head: Normocephalic and atraumatic.  Eyes: Pupils are equal, round, and reactive to light. Conjunctivae and EOM are normal.  Cardiovascular: Normal rate, regular rhythm and normal heart sounds. Exam reveals no gallop and no friction rub.  No murmur heard. Pulmonary/Chest: Breath sounds normal. No respiratory distress. She has no wheezes.  Abdominal: Soft. Bowel sounds are normal. There is no abdominal tenderness.  Musculoskeletal:        General: No tenderness. Normal range of motion.     Cervical back: Neck supple.  Neurological: She is alert and oriented to person, place, and time.  Skin: Skin is warm and dry. No rash noted. No pallor.  Psychiatric: Affect normal.    Assessment/Plan:  Ms. Bianchi is a 79 yo F w/ a PMHx significant for DM2, depression and a distant uterine cancer s/p TAH presenting with fatigue, weakness, possible rectal bleeding and a 20 lb weight loss who was recently found to have a glucose in the 600s and a Hgb of 7 at her PCP's office and was told to come to the ED for a blood transfusion.    Fatigue/Weakness/Symptomatic Anemia: Patient presenting with 2-3 months of worsening fatigue and weakness with associated 20 lb weight loss who was found to have a Hgb of 7 at her PCP's office on 1/4. She was subsquently instructed to present to the ED where she received iron studies that reflected iron deficiency. POC occult blood was negative. She was treated with 2 units of pRBC's which improved her hemoglobin to 9.2. Of note, patent reports history of colonoscopy 10 years ago which was negative for polyps although son contests that this may not be reliable information.  -fu GI recommendations  -schedule endoscopy and colonoscopy -routine CBC's  Uncontrolled Diabetes: Patient with known history of diabetes on glipizide and metformin. Found to have blood sugar over 600 by PCP on 1/4. Likely not acute given her lack of acute symptoms and history per son regarding not regularly taking her diabetes medications. Hgb A1c = 16.7%. -SSI -carb modified diet  Acute kidney injury:  Patient with elevated creatinine of 1.66 this morning. She endorses urinating frequently throughout the night. Spoke with PCP who states her creatine was elevated at 1.40 on 1/4. Given this is only a mild elevation, patient is safe for discharge. Will encourage good PO hydration on discharge.   Depression: Patient with history of depression per son. He states she is not always adherent to her depression medication regimen. No  meds reported by patient. -fu with son regarding patient's medications and restart patient on home depression meds as appropriate   Disposition: Likely discharge today pending whether GI chooses to pursue endoscopy/colonoscopy as outpatient vs inpatient.     LOS: 0 days   Benedetto Goad, Medical Student 01/31/2019, 9:47 AM

## 2019-01-31 NOTE — Progress Notes (Signed)
Date: 01/31/2019  Patient name: Laura Winters  Medical record number: [id number]  Date of birth: 05-16-1940   I have seen and evaluated Laura Winters and discussed their care with the Residency Team.  In brief, patient is a 79 year old female with a past medical history of type 2 diabetes, uterine cancer status post resection, depression who presented to the ED with worsening fatigue and weakness over the last couple of months.  Patient states that over the last couple months he has noted progressively worsening fatigue and weakness and unsteady gait.  Patient states that she has no energy and wants to sleep all day.  Patient recently moved to the area from [place] to be closer to her family and established with a PCP in the area.  Patient was noted to have elevated blood sugars as well as anemia with hemoglobin of 7 at the PCPs office and was referred to the ED for further evaluation.  Of note, patient does endorse a 20 pound unintentional weight loss.  No chest pain, no shortness of breath, no palpitations, no melena, no hematochezia, no headache, no blurry vision, no focal weakness, no nausea or vomiting, no diarrhea, no abdominal pain.  Today patient states that she feels much better and that her energy levels have improved.  She would like to have a colonoscopy done as an inpatient if possible.  No other complaints at this time.  PMHx, Fam Hx, and/or Soc Hx : As per resident admit note  Vitals:   01/30/19 2229 01/31/19 0410  BP: (!) 96/52 103/68  Pulse: 70 63  Resp: 16 16  Temp: 97.7 F (36.5 C) 98.8 F (37.1 C)  SpO2: 100% 99%   General: Awake, alert, oriented x3, NAD CVS: Regular rhythm, normal heart sounds Lungs: CTA laterally Abdomen: Soft, nontender, nondistended, normoactive bowel sounds Extremities: No edema noted, nontender to palpation Skin: Warm and dry Psych: Normal mood and affect Neuro: Moving all 4 extremities, oriented x3  Assessment and Plan: I have seen and  evaluated the patient as outlined above. I agree with the formulated Assessment and Plan as detailed in the residents' note, with the following changes:   1.  Symptomatic anemia likely secondary to chronic blood loss: -Patient presented to her PCP with worsening weakness and fatigue and was found to have anemia with a hemoglobin of 7 and was referred to the ED for further evaluation.  Patient's iron studies were consistent with iron deficiency indicating blood loss.  I suspect this is chronic blood loss and has been occurring over the last couple of months as patient has had the symptoms for that time period. -Patient is much improved status post 2 units PRBC and hemoglobin has responded appropriately. -I am concerned given her weight loss that she may have an underlying malignancy.  GI follow-up and recommendations appreciated.  Patient will need endoscopic work-up (colonoscopy and possible EGD) -Patient would like this work-up to be done inpatient if possible.  Will discuss this with GI. -FOBT was negative and patient's hemoglobin responded appropriately to transfusions indicating that she does not seem to be actively bleeding at this time -We will monitor CBC daily. -No further work-up at this time  2.  Uncontrolled type II diabetes: -Patient's blood sugars were noted to be elevated over 600 by her PCP.  Patient's A1c was 16.7.  I suspect that patient has not been compliant with her medications for a while and that her blood sugars are not well controlled -Given patient's elevated blood sugars here  we will start the patient on Lantus and continue with SSI -We will monitor blood sugars closely  3.  Likely CKD: -Patient noted to have an elevated creatinine 1.66 today up from 1.2 on admission.  Med student spoke with PCP who states that her creatinine was 1.4 on January 4. -This is likely CKD from uncontrolled diabetes.  No further work-up at this time. -Patient will need to follow-up with PCP as  an outpatient for this  Aldine Contes, MD 1/9/202111:33 AM

## 2019-01-31 NOTE — Progress Notes (Addendum)
     Progress Note    ASSESSMENT AND PLAN:  79 yo female with IDA, FOBT negative. No overt GI bleeding. Hgb improved from 7.2 to 9.2 following two units of blood yesterday.   -Patient is stable for discharge from GI standpoint. Our office is closed today but I will send message to my nurse to get patient a follow up with me to discuss EGD / colonoscopy.        SUBJECTIVE   Happy about improvement in blood sugars. Happy about going home    OBJECTIVE:     Vital signs in last 24 hours: Temp:  [97.3 F (36.3 C)-98.8 F (37.1 C)] 98.8 F (37.1 C) (01/09 0410) Pulse Rate:  [63-126] 63 (01/09 0410) Resp:  [11-23] 16 (01/09 0410) BP: (96-149)/(52-89) 103/68 (01/09 0410) SpO2:  [98 %-100 %] 99 % (01/09 0410) Weight:  [61.6 kg] 61.6 kg (01/08 1721) Last BM Date: 01/30/19 General:   Alert, NAD EENT:  Normal hearing, non icteric sclera   Heart:  Regular rate and rhythm;  No lower extremity edema   Pulm: Normal respiratory effort   Abdomen:  Soft, nondistended, nontender.  Normal bowel sounds.          Neurologic:  Alert and  oriented x4;  grossly normal neurologically. Psych:  Pleasant, cooperative.  Normal mood and affect.   Intake/Output from previous day: 01/08 0701 - 01/09 0700 In: 720 [P.O.:120; I.V.:100; IV Piggyback:500] Out: -  Intake/Output this shift: Total I/O In: 364 [P.O.:364] Out: 300 [Urine:300]  Lab Results: Recent Labs    01/30/19 1044 01/31/19 0043  WBC 7.5 5.9  HGB 7.2* 9.2*  HCT 26.0* 30.1*  PLT 287 190   BMET Recent Labs    01/30/19 1044 01/31/19 0043  NA 133* 131*  K 4.6 4.5  CL 100 102  CO2 23 21*  GLUCOSE 421* 181*  BUN 15 19  CREATININE 1.29* 1.66*  CALCIUM 9.2 8.5*   LFT Recent Labs    01/30/19 1044  PROT 6.2*  ALBUMIN 3.2*  AST 17  ALT 13  ALKPHOS 63  BILITOT 0.6   PT/INR No results for input(s): LABPROT, INR in the last 72 hours. Hepatitis Panel No results for input(s): HEPBSAG, HCVAB, HEPAIGM, HEPBIGM in the  last 72 hours.  DG Chest Portable 1 View  Result Date: 01/30/2019 CLINICAL DATA:  Anemia and weakness. EXAM: PORTABLE CHEST 1 VIEW COMPARISON:  None. FINDINGS: The heart size and mediastinal contours are within normal limits. There is no evidence of pulmonary edema, consolidation, pneumothorax, nodule or pleural fluid. The visualized skeletal structures are unremarkable. IMPRESSION: No active disease. Electronically Signed   By: Aletta Edouard M.D.   On: 01/30/2019 12:59     Active Problems:   Symptomatic anemia   Hyperglycemia     LOS: 0 days   Tye Savoy ,NP 01/31/2019, 12:36 PM   GI ATTENDING  Interval history data reviewed.  I was contacted by the primary service.  The patient wishes to go home and have her GI work-up as an outpatient.  Agree with interval progress note as outlined.  We will arrange office follow-up.  At that time we will schedule her colonoscopy and upper endoscopy to evaluate iron deficiency anemia.  In the interim she should take oral iron supplement.  Please note, there was no overt or occult GI bleeding.  Docia Chuck. Geri Seminole., M.D. [facility] of Gastroenterology

## 2019-02-01 LAB — TYPE AND SCREEN
ABO/RH(D): O POS
Antibody Screen: NEGATIVE
Unit division: 0
Unit division: 0
Unit division: 0
Unit division: 0

## 2019-02-01 LAB — BPAM RBC
Blood Product Expiration Date: 202102092359
Blood Product Expiration Date: 202102092359
Blood Product Expiration Date: 202102092359
Blood Product Expiration Date: 202102092359
ISSUE DATE / TIME: 202101081234
ISSUE DATE / TIME: 202101081746
Unit Type and Rh: 5100
Unit Type and Rh: 5100
Unit Type and Rh: 5100
Unit Type and Rh: 5100

## 2019-02-03 ENCOUNTER — Other Ambulatory Visit: Payer: Self-pay | Admitting: Family Medicine

## 2019-02-03 DIAGNOSIS — Z87891 Personal history of nicotine dependence: Secondary | ICD-10-CM

## 2019-02-03 DIAGNOSIS — D509 Iron deficiency anemia, unspecified: Secondary | ICD-10-CM

## 2019-02-03 DIAGNOSIS — Z122 Encounter for screening for malignant neoplasm of respiratory organs: Secondary | ICD-10-CM

## 2019-02-03 DIAGNOSIS — D649 Anemia, unspecified: Secondary | ICD-10-CM

## 2019-02-03 DIAGNOSIS — Z8542 Personal history of malignant neoplasm of other parts of uterus: Secondary | ICD-10-CM

## 2019-02-05 ENCOUNTER — Ambulatory Visit (INDEPENDENT_AMBULATORY_CARE_PROVIDER_SITE_OTHER): Payer: Medicare Other | Admitting: Nurse Practitioner

## 2019-02-05 ENCOUNTER — Other Ambulatory Visit: Payer: Medicare Other

## 2019-02-05 ENCOUNTER — Encounter: Payer: Self-pay | Admitting: Nurse Practitioner

## 2019-02-05 VITALS — BP 120/70 | HR 88 | Temp 98.0°F | Ht 60.0 in | Wt 140.0 lb

## 2019-02-05 DIAGNOSIS — D509 Iron deficiency anemia, unspecified: Secondary | ICD-10-CM

## 2019-02-05 DIAGNOSIS — R197 Diarrhea, unspecified: Secondary | ICD-10-CM | POA: Diagnosis not present

## 2019-02-05 NOTE — Progress Notes (Signed)
Chief Complaint:  Hospital follow up    IMPRESSION and PLAN:    79 year old female with PMH significant for DM2, hyperlipidemia, uterine cancer, obesity, AKI (recent admission).   1.  Severe iron deficiency anemia, hgb 7.2 post -transfusion hemoglobin (inpatient) improved to 9.2.  Patient was hemodynamically stable in the hospital, no overt bleeding.  Outpatient evaluation of anemia felt appropriate, actually requested by patient.  --For further evaluation of iron deficiency anemia patient will be scheduled for EGD and colonoscopy once C-diff study results return. The risks and benefits of EGD and colonoscopy with possible polypectomy / biopsies were discussed and the patient agrees to proceed.  --I asked her to hold iron for about 7 days prior to procedures  2. Loose stool.  Having 2-3 loose movements a day since leaving the hospital.  Typically she only has solid stools so this is very unusual for her -Obtain stool for C. difficile.     3. Uncontrolled DM2, recently started on insulin and says blood sugars have significantly improved.    HPI:     Patient is a 79 year old female who we met in the hospital earlier this month.  She had been sent by her PCP to the hospital for blood transfusion when labs revealed a hemoglobin of 7.  Patient had lost 20 or so pounds in the preceding months. She is regaining now.  She had no overt bleeding.  She takes a low-dose baby aspirin every day and uses ibuprofen but no more than a handful of times a month but hasn't taken any in a couple of months.  Her stools were heme-negative. Ferritin was 6.  Laura Winters has no abdominal pain, nausea nor vomiting.  No overt bleeding, stools now black on iron.  Since hospital discharge Laura Winters has been having loose stool 2-3 times a day.  This is very unusual for her.  Typically has solid stool of less frequency.   Data Reviewed:  Most recent labs 01/31/2019 Hemoglobin 9.2, MCV 79.8, platelets 190 BUN 19, creatinine  1.66  Review of systems:     No chest pain, no SOB, no fevers, no urinary sx   Past Medical History:  Diagnosis Date  . Anemia   . Depression   . DM (diabetes mellitus) ([place])    type 2  . Hyperlipidemia   . Uterine cancer [facility])     Patient's surgical history, family medical history, social history, medications and allergies were all reviewed in Epic   Serum creatinine: 1.66 mg/dL (H) 01/31/19 0043 Estimated creatinine clearance: 23.2 mL/min (A)  Current Outpatient Medications  Medication Sig Dispense Refill  . aspirin EC 81 MG tablet Take 81 mg by mouth daily.    . Cholecalciferol (VITAMIN D3 PO) Take 1 tablet by mouth daily.     . Cyanocobalamin (VITAMIN B12 PO) Take 2 tablets by mouth daily.    . ferrous sulfate 325 (65 FE) MG tablet Take 1 tablet (325 mg total) by mouth daily. 30 tablet 3  . glipiZIDE (GLUCOTROL) 10 MG tablet Take 20 mg by mouth 2 (two) times daily.    Marland Kitchen lisinopril (ZESTRIL) 20 MG tablet Take 20 mg by mouth daily.    . metFORMIN (GLUCOPHAGE) 1000 MG tablet Take 1,000 mg by mouth 2 (two) times daily.    . sertraline (ZOLOFT) 25 MG tablet Take 25 mg by mouth daily.    . simvastatin (ZOCOR) 20 MG tablet Take 20 mg by mouth every evening.     No  current facility-administered medications for this visit.    Physical Exam:     BP 120/70   Pulse 88   Temp 98 F (36.7 C)   Ht 5' (1.524 m)   Wt 140 lb (63.5 kg)   BMI 27.34 kg/m   GENERAL:  Pleasant female in NAD PSYCH: : Cooperative, normal affect EENT:  conjunctiva pink, mucous membranes moist, neck supple without masses CARDIAC:  RRR,  no peripheral edema PULM: Normal respiratory effort, lungs CTA bilaterally, no wheezing ABDOMEN:  Nondistended, soft, nontender. No obvious masses, no hepatomegaly,  normal bowel sounds SKIN:  turgor, no lesions seen Musculoskeletal:  Normal muscle tone, normal strength NEURO: Alert and oriented x 3, no focal neurologic deficits   Tye Savoy , NP 02/05/2019, 2:45  PM

## 2019-02-05 NOTE — Patient Instructions (Signed)
If you are age 79 or older, your body mass index should be between 23-30. Your Body mass index is 27.34 kg/m. If this is out of the aforementioned range listed, please consider follow up with your Primary Care Provider.  If you are age 13 or younger, your body mass index should be between 19-25. Your Body mass index is 27.34 kg/m. If this is out of the aformentioned range listed, please consider follow up with your Primary Care Provider.   Your provider has requested that you go to the basement level for lab work before leaving today. Press "B" on the elevator. The lab is located at the first door on the left as you exit the elevator. C DIFF  Endoscopy and colonoscopy with Dr. [name] have been recommended. Will await lab results.  Thank you for choosing me and [facility].   Tye Savoy, NP

## 2019-02-06 ENCOUNTER — Encounter: Payer: Self-pay | Admitting: Nurse Practitioner

## 2019-02-06 NOTE — Progress Notes (Signed)
Assessment and plan reviewed 

## 2019-02-10 ENCOUNTER — Encounter: Payer: Self-pay | Admitting: Internal Medicine

## 2019-02-11 ENCOUNTER — Other Ambulatory Visit: Payer: Self-pay | Admitting: Family Medicine

## 2019-02-11 DIAGNOSIS — Z87891 Personal history of nicotine dependence: Secondary | ICD-10-CM

## 2019-02-11 DIAGNOSIS — Z122 Encounter for screening for malignant neoplasm of respiratory organs: Secondary | ICD-10-CM

## 2019-02-13 ENCOUNTER — Ambulatory Visit
Admission: RE | Admit: 2019-02-13 | Discharge: 2019-02-13 | Disposition: A | Discharge status: Home / Self Care | Payer: Medicare Other | Source: Ambulatory Visit | Attending: Family Medicine | Admitting: Family Medicine

## 2019-02-13 DIAGNOSIS — Z8542 Personal history of malignant neoplasm of other parts of uterus: Secondary | ICD-10-CM

## 2019-02-13 DIAGNOSIS — D649 Anemia, unspecified: Secondary | ICD-10-CM

## 2019-02-13 DIAGNOSIS — Z122 Encounter for screening for malignant neoplasm of respiratory organs: Secondary | ICD-10-CM

## 2019-02-13 DIAGNOSIS — Z87891 Personal history of nicotine dependence: Secondary | ICD-10-CM

## 2019-02-13 DIAGNOSIS — D509 Iron deficiency anemia, unspecified: Secondary | ICD-10-CM

## 2019-02-16 ENCOUNTER — Other Ambulatory Visit: Payer: Self-pay

## 2019-02-16 ENCOUNTER — Ambulatory Visit (AMBULATORY_SURGERY_CENTER): Payer: Self-pay | Admitting: *Deleted

## 2019-02-16 VITALS — Temp 96.6°F | Ht 60.0 in | Wt 139.0 lb

## 2019-02-16 DIAGNOSIS — Z01818 Encounter for other preprocedural examination: Secondary | ICD-10-CM

## 2019-02-16 DIAGNOSIS — D509 Iron deficiency anemia, unspecified: Secondary | ICD-10-CM

## 2019-02-16 DIAGNOSIS — R197 Diarrhea, unspecified: Secondary | ICD-10-CM

## 2019-02-16 MED ORDER — NA SULFATE-K SULFATE-MG SULF 17.5-3.13-1.6 GM/177ML PO SOLN: ORAL | 0 refills | Status: AC

## 2019-02-16 NOTE — Progress Notes (Signed)
Patient is here in-person for PV.Nephew with pt also in PV (temp 97.0) . Patient denies any allergies to eggs or soy. Patient denies any problems with anesthesia/sedation. Patient denies any oxygen use at home. Patient denies taking any diet/weight loss medications or blood thinners. Patient is not being treated for MRSA or C-diff.  EMMI education assisgned to the patient for the procedure, this was explained and instructions given to patient. COVID-19 screening test is on 2/1 320pm, the pt is aware. Pt is aware that care partner will wait in the car during procedure; if they feel like they will be too hot or cold to wait in the car; they may wait in the 4 th floor lobby. Patient is aware to bring only one care partner. We want them to wear a mask (we do not have any that we can provide them), practice social distancing, and we will check their temperatures when they get here.  I did remind the patient that their care partner needs to stay in the parking lot the entire time and have a cell phone available, we will call them when the pt is ready for discharge. Patient will wear mask into building.    Suprep sample given to pt. Pt aware to stop Iron 5 days before exams.

## 2019-02-23 ENCOUNTER — Other Ambulatory Visit: Payer: Self-pay

## 2019-02-23 ENCOUNTER — Other Ambulatory Visit: Payer: Self-pay | Admitting: Internal Medicine

## 2019-02-23 ENCOUNTER — Ambulatory Visit (INDEPENDENT_AMBULATORY_CARE_PROVIDER_SITE_OTHER): Payer: Medicare Other

## 2019-02-23 DIAGNOSIS — Z1159 Encounter for screening for other viral diseases: Secondary | ICD-10-CM

## 2019-02-24 LAB — SARS CORONAVIRUS 2 (TAT 6-24 HRS): SARS Coronavirus 2: NEGATIVE

## 2019-02-26 ENCOUNTER — Other Ambulatory Visit (INDEPENDENT_AMBULATORY_CARE_PROVIDER_SITE_OTHER): Payer: Medicare Other

## 2019-02-26 ENCOUNTER — Encounter: Payer: Self-pay | Admitting: Internal Medicine

## 2019-02-26 ENCOUNTER — Ambulatory Visit (AMBULATORY_SURGERY_CENTER): Payer: Medicare Other | Admitting: Internal Medicine

## 2019-02-26 ENCOUNTER — Other Ambulatory Visit: Payer: Self-pay

## 2019-02-26 ENCOUNTER — Encounter: Payer: Medicare Other | Admitting: Internal Medicine

## 2019-02-26 VITALS — BP 114/66 | HR 74 | Temp 96.2°F | Resp 11 | Ht 60.0 in | Wt 139.0 lb

## 2019-02-26 DIAGNOSIS — K219 Gastro-esophageal reflux disease without esophagitis: Secondary | ICD-10-CM

## 2019-02-26 DIAGNOSIS — R197 Diarrhea, unspecified: Secondary | ICD-10-CM

## 2019-02-26 DIAGNOSIS — D509 Iron deficiency anemia, unspecified: Secondary | ICD-10-CM | POA: Diagnosis not present

## 2019-02-26 DIAGNOSIS — K2950 Unspecified chronic gastritis without bleeding: Secondary | ICD-10-CM | POA: Diagnosis not present

## 2019-02-26 DIAGNOSIS — K5730 Diverticulosis of large intestine without perforation or abscess without bleeding: Secondary | ICD-10-CM | POA: Diagnosis not present

## 2019-02-26 DIAGNOSIS — K648 Other hemorrhoids: Secondary | ICD-10-CM

## 2019-02-26 LAB — CBC WITH DIFFERENTIAL/PLATELET
Basophils Absolute: 0.1 10*3/uL (ref 0.0–0.1)
Basophils Relative: 0.6 % (ref 0.0–3.0)
Eosinophils Absolute: 0.3 10*3/uL (ref 0.0–0.7)
Eosinophils Relative: 3.5 % (ref 0.0–5.0)
HCT: 28.6 % — ABNORMAL LOW (ref 36.0–46.0)
Hemoglobin: 9.2 g/dL — ABNORMAL LOW (ref 12.0–15.0)
Lymphocytes Relative: 14.3 % (ref 12.0–46.0)
Lymphs Abs: 1.3 10*3/uL (ref 0.7–4.0)
MCHC: 32.3 g/dL (ref 30.0–36.0)
MCV: 82.1 fl (ref 78.0–100.0)
Monocytes Absolute: 0.8 10*3/uL (ref 0.1–1.0)
Monocytes Relative: 8.4 % (ref 3.0–12.0)
Neutro Abs: 6.8 10*3/uL (ref 1.4–7.7)
Neutrophils Relative %: 73.2 % (ref 43.0–77.0)
Platelets: 255 10*3/uL (ref 150.0–400.0)
RBC: 3.49 Mil/uL — ABNORMAL LOW (ref 3.87–5.11)
RDW: 21.8 % — ABNORMAL HIGH (ref 11.5–15.5)
WBC: 9.3 10*3/uL (ref 4.0–10.5)

## 2019-02-26 LAB — FERRITIN: Ferritin: 13.4 ng/mL (ref 10.0–291.0)

## 2019-02-26 MED ORDER — SODIUM CHLORIDE 0.9 % IV SOLN: 500.0000 mL | Freq: Once | INTRAVENOUS | Status: AC

## 2019-02-26 MED ORDER — OMEPRAZOLE 20 MG PO CPDR: 20.0000 mg | DELAYED_RELEASE_CAPSULE | Freq: Every day | ORAL | 11 refills | Status: DC

## 2019-02-26 NOTE — Patient Instructions (Signed)
YOU HAD AN ENDOSCOPIC PROCEDURE TODAY AT THE [facility]:   Refer to the procedure report that was given to you for any specific questions about what was found during the examination.  If the procedure report does not answer your questions, please call your gastroenterologist to clarify.  If you requested that your care partner not be given the details of your procedure findings, then the procedure report has been included in a sealed envelope for you to review at your convenience later.  YOU SHOULD EXPECT: Some feelings of bloating in the abdomen. Passage of more gas than usual.  Walking can help get rid of the air that was put into your GI tract during the procedure and reduce the bloating. If you had a lower endoscopy (such as a colonoscopy or flexible sigmoidoscopy) you may notice spotting of blood in your stool or on the toilet paper. If you underwent a bowel prep for your procedure, you may not have a normal bowel movement for a few days.  Please Note:  You might notice some irritation and congestion in your nose or some drainage.  This is from the oxygen used during your procedure.  There is no need for concern and it should clear up in a day or so.  SYMPTOMS TO REPORT IMMEDIATELY:   Following lower endoscopy (colonoscopy or flexible sigmoidoscopy):  Excessive amounts of blood in the stool  Significant tenderness or worsening of abdominal pains  Swelling of the abdomen that is new, acute  Fever of 100F or higher   Following upper endoscopy (EGD)  Vomiting of blood or coffee ground material  New chest pain or pain under the shoulder blades  Painful or persistently difficult swallowing  New shortness of breath  Fever of 100F or higher  Black, tarry-looking stools  For urgent or emergent issues, a gastroenterologist can be reached at any hour by calling [phone].   DIET:  We do recommend a small meal at first, but then you may proceed to your regular diet.  Drink  plenty of fluids but you should avoid alcoholic beverages for 24 hours.  ACTIVITY:  You should plan to take it easy for the rest of today and you should NOT DRIVE or use heavy machinery until tomorrow (because of the sedation medicines used during the test).    FOLLOW UP: Our staff will call the number listed on your records 48-72 hours following your procedure to check on you and address any questions or concerns that you may have regarding the information given to you following your procedure. If we do not reach you, we will leave a message.  We will attempt to reach you two times.  During this call, we will ask if you have developed any symptoms of COVID 19. If you develop any symptoms (ie: fever, flu-like symptoms, shortness of breath, cough etc.) before then, please call [phone].  If you test positive for Covid 19 in the 2 weeks post procedure, please call and report this information to us.    If any biopsies were taken you will be contacted by phone or by letter within the next 1-3 weeks.  Please call us at [phone] if you have not heard about the biopsies in 3 weeks.    SIGNATURES/CONFIDENTIALITY: You and/or your care partner have signed paperwork which will be entered into your electronic medical record.  These signatures attest to the fact that that the information above on your After Visit Summary has been reviewed and is   understood.  Full responsibility of the confidentiality of this discharge information lies with you and/or your care-partner. 

## 2019-02-26 NOTE — Op Note (Signed)
[place] Patient Name: Laura Winters Procedure Date: 02/26/2019 10:02 AM MRN: [id number] Endoscopist: Docia Chuck. Laura Winters , MD Age: 79 Referring MD:  Date of Birth: 1940/12/22 Gender: Female Account #: [id number] Procedure:                Colonoscopy with biopsies Indications:              Iron deficiency anemia; diarrhea Medicines:                Monitored Anesthesia Care Procedure:                Pre-Anesthesia Assessment:                           - Prior to the procedure, a History and Physical                            was performed, and patient medications and                            allergies were reviewed. The patient's tolerance of                            previous anesthesia was also reviewed. The risks                            and benefits of the procedure and the sedation                            options and risks were discussed with the patient.                            All questions were answered, and informed consent                            was obtained. Prior Anticoagulants: The patient has                            taken no previous anticoagulant or antiplatelet                            agents. ASA Grade Assessment: II - A patient with                            mild systemic disease. After reviewing the risks                            and benefits, the patient was deemed in                            satisfactory condition to undergo the procedure.                           After obtaining informed consent, the colonoscope  was passed under direct vision. Throughout the                            procedure, the patient's blood pressure, pulse, and                            oxygen saturations were monitored continuously. The                            Colonoscope was introduced through the anus and                            advanced to the the cecum, identified by                            appendiceal orifice and  ileocecal valve. The                            terminal ileum, ileocecal valve, appendiceal                            orifice, and rectum were photographed. The quality                            of the bowel preparation was excellent. The                            colonoscopy was performed without difficulty. The                            patient tolerated the procedure well. The bowel                            preparation used was SUPREP via split dose                            instruction. Scope In: 10:09:53 AM Scope Out: 10:22:20 AM Scope Withdrawal Time: 0 hours 9 minutes 55 seconds  Total Procedure Duration: 0 hours 12 minutes 27 seconds  Findings:                 The terminal ileum appeared normal.                           Scattered small-mouthed diverticula were found in                            the transverse colon and left colon.                           Internal hemorrhoids were found during retroflexion.                           The entire examined colon appeared otherwise normal  on direct and retroflexion views. Biopsies for                            histology were taken with a cold forceps from the                            entire colon for evaluation of microscopic colitis. Complications:            No immediate complications. Estimated blood loss:                            None. Estimated Blood Loss:     Estimated blood loss: none. Impression:               - The examined portion of the ileum was normal.                           - Diverticulosis in the transverse colon and in the                            left colon.                           - Internal hemorrhoids.                           - The entire examined colon is otherwise normal on                            direct and retroflexion views. Recommendation:           - Repeat colonoscopy is not recommended for                            surveillance.                            - Patient has a contact number available for                            emergencies. The signs and symptoms of potential                            delayed complications were discussed with the                            patient. Return to normal activities tomorrow.                            Written discharge instructions were provided to the                            patient.                           - Resume previous diet.                           -  Continue present medications. Resume iron therapy                           - Await pathology results.                           - EGD today. Please see report regarding findings                            and recommendations Laura Kamel N. Laura Pastor, MD 02/26/2019 10:37:08 AM This report has been signed electronically.

## 2019-02-26 NOTE — Op Note (Signed)
[place] Patient Name: Laura Winters Procedure Date: 02/26/2019 10:01 AM MRN: [id number] Endoscopist: Docia Chuck. [name] , MD Age: 79 Referring MD:  Date of Birth: 07-05-1940 Gender: Female Account #: [id number] Procedure:                Upper GI endoscopy with biopsies Indications:              Iron deficiency anemia Medicines:                Monitored Anesthesia Care Procedure:                Pre-Anesthesia Assessment:                           - Prior to the procedure, a History and Physical                            was performed, and patient medications and                            allergies were reviewed. The patient's tolerance of                            previous anesthesia was also reviewed. The risks                            and benefits of the procedure and the sedation                            options and risks were discussed with the patient.                            All questions were answered, and informed consent                            was obtained. Prior Anticoagulants: The patient has                            taken no previous anticoagulant or antiplatelet                            agents. ASA Grade Assessment: II - A patient with                            mild systemic disease. After reviewing the risks                            and benefits, the patient was deemed in                            satisfactory condition to undergo the procedure.                           After obtaining informed consent, the endoscope was  passed under direct vision. Throughout the                            procedure, the patient's blood pressure, pulse, and                            oxygen saturations were monitored continuously. The                            Endoscope was introduced through the mouth, and                            advanced to the second part of duodenum. The upper                            GI endoscopy was  accomplished without difficulty.                            The patient tolerated the procedure well. Scope In: Scope Out: Findings:                 Mild reflux esophagitis as manifested by                            inflammation around the mucosal Z-line. As well,                            early stricture formation.                           The stomach small herniated. Also, multiple antral                            erosions. Biopsies were taken with a cold forceps                            for Helicobacter pylori testing using CLOtest.                           The examined duodenum was normal. Biopsies for                            histology were taken with a cold forceps for                            evaluation of celiac disease.                           The cardia and gastric fundus were normal on                            retroflexion. Complications:            No immediate complications. Estimated Blood Loss:     Estimated blood loss: none. Impression:               -  Reflux esophagitis.                           - Erosive gastritis. Biopsied.                           - Normal examined duodenum. Biopsied. Recommendation:           1. Prescribe omeprazole 20 mg daily; #30; 11                            refills. This is to treat the inflammation of the                            esophagus as well as the stomach and prevent ulcer                            development                           2. Resume iron therapy daily                           3. Obtain CBC and ferritin level today. We will                            contact you with the results                           4. Resume care with your primary provider. Docia Chuck. [name], MD 02/26/2019 10:45:05 AM This report has been signed electronically.

## 2019-02-26 NOTE — Progress Notes (Signed)
Pt's states no medical or surgical changes since previsit or office visit.  Temp taken by JB VS taken by CW 

## 2019-02-26 NOTE — Progress Notes (Signed)
Report to PACU, RN, vss, BBS= Clear.  

## 2019-02-27 LAB — HELICOBACTER PYLORI SCREEN-BIOPSY: UREASE: NEGATIVE

## 2019-03-02 ENCOUNTER — Telehealth: Payer: Self-pay

## 2019-03-02 NOTE — Telephone Encounter (Signed)
  Follow up Call-  Call back number 02/26/2019  Post procedure Call Back phone  # [phone]  Permission to leave phone message Yes     Patient questions:  Do you have a fever, pain , or abdominal swelling? No. Pain Score  0 *  Have you tolerated food without any problems? Yes.    Have you been able to return to your normal activities? Yes.    Do you have any questions about your discharge instructions: Diet   No. Medications  No. Follow up visit  No.  Do you have questions or concerns about your Care? No.  Actions: * If pain score is 4 or above: No action needed, pain <4.  1. Have you developed a fever since your procedure? no  2.   Have you had an respiratory symptoms (SOB or cough) since your procedure? no  3.   Have you tested positive for COVID 19 since your procedure no  4.   Have you had any family members/close contacts diagnosed with the COVID 19 since your procedure?  no   If yes to any of these questions please route to Joylene John, RN and Alphonsa Gin, Therapist, sports.

## 2019-03-03 ENCOUNTER — Encounter: Payer: Self-pay | Admitting: Internal Medicine

## 2019-03-06 ENCOUNTER — Telehealth: Payer: Self-pay | Admitting: Internal Medicine

## 2019-03-06 NOTE — Telephone Encounter (Signed)
Patient's nephew is calling returning your call.

## 2019-03-06 NOTE — Telephone Encounter (Signed)
See result note.  

## 2019-03-14 ENCOUNTER — Other Ambulatory Visit: Payer: Self-pay | Admitting: Family Medicine

## 2019-03-14 DIAGNOSIS — E278 Other specified disorders of adrenal gland: Secondary | ICD-10-CM

## 2019-03-23 ENCOUNTER — Ambulatory Visit
Admission: RE | Admit: 2019-03-23 | Discharge: 2019-03-23 | Disposition: A | Discharge status: Home / Self Care | Payer: Medicare Other | Source: Ambulatory Visit | Attending: Family Medicine | Admitting: Family Medicine

## 2019-03-23 DIAGNOSIS — E278 Other specified disorders of adrenal gland: Secondary | ICD-10-CM

## 2019-03-23 MED ORDER — IOPAMIDOL (ISOVUE-300) INJECTION 61%
80.0000 mL | Freq: Once | INTRAVENOUS | Status: AC | PRN
  Administered 2019-03-23: 80 mL via INTRAVENOUS

## 2019-07-06 ENCOUNTER — Other Ambulatory Visit: Payer: Self-pay | Admitting: Family Medicine

## 2019-07-06 ENCOUNTER — Other Ambulatory Visit (HOSPITAL_COMMUNITY): Payer: Self-pay | Admitting: Family Medicine

## 2019-07-06 DIAGNOSIS — E278 Other specified disorders of adrenal gland: Secondary | ICD-10-CM

## 2019-07-20 ENCOUNTER — Other Ambulatory Visit: Payer: Self-pay

## 2019-07-20 ENCOUNTER — Ambulatory Visit (HOSPITAL_COMMUNITY)
Admission: RE | Admit: 2019-07-20 | Discharge: 2019-07-20 | Disposition: A | Discharge status: Home / Self Care | Payer: Medicare Other | Source: Ambulatory Visit | Attending: Family Medicine | Admitting: Family Medicine

## 2019-07-20 DIAGNOSIS — E278 Other specified disorders of adrenal gland: Secondary | ICD-10-CM

## 2019-07-20 LAB — GLUCOSE, CAPILLARY: Glucose-Capillary: 136 mg/dL — ABNORMAL HIGH (ref 70–99)

## 2019-07-20 MED ORDER — FLUDEOXYGLUCOSE F - 18 (FDG) INJECTION
6.5500 | Freq: Once | INTRAVENOUS | Status: AC | PRN
  Administered 2019-07-20: 6.55 via INTRAVENOUS

## 2019-11-08 ENCOUNTER — Encounter (HOSPITAL_COMMUNITY): Payer: Self-pay

## 2019-11-08 ENCOUNTER — Emergency Department (HOSPITAL_COMMUNITY)
Admission: EM | Admit: 2019-11-08 | Discharge: 2019-11-11 | Disposition: A | Discharge status: Home / Self Care | Payer: Medicare Other | Attending: Emergency Medicine | Admitting: Emergency Medicine

## 2019-11-08 DIAGNOSIS — F1590 Other stimulant use, unspecified, uncomplicated: Secondary | ICD-10-CM | POA: Diagnosis not present

## 2019-11-08 DIAGNOSIS — R000 Tachycardia, unspecified: Secondary | ICD-10-CM | POA: Diagnosis not present

## 2019-11-08 DIAGNOSIS — Z794 Long term (current) use of insulin: Secondary | ICD-10-CM | POA: Diagnosis not present

## 2019-11-08 DIAGNOSIS — Z79899 Other long term (current) drug therapy: Secondary | ICD-10-CM | POA: Insufficient documentation

## 2019-11-08 DIAGNOSIS — R42 Dizziness and giddiness: Secondary | ICD-10-CM | POA: Insufficient documentation

## 2019-11-08 DIAGNOSIS — F17210 Nicotine dependence, cigarettes, uncomplicated: Secondary | ICD-10-CM | POA: Diagnosis not present

## 2019-11-08 DIAGNOSIS — Z7982 Long term (current) use of aspirin: Secondary | ICD-10-CM | POA: Diagnosis not present

## 2019-11-08 DIAGNOSIS — Z20822 Contact with and (suspected) exposure to covid-19: Secondary | ICD-10-CM | POA: Insufficient documentation

## 2019-11-08 DIAGNOSIS — R296 Repeated falls: Secondary | ICD-10-CM | POA: Diagnosis not present

## 2019-11-08 DIAGNOSIS — R531 Weakness: Secondary | ICD-10-CM | POA: Diagnosis not present

## 2019-11-08 DIAGNOSIS — E1165 Type 2 diabetes mellitus with hyperglycemia: Secondary | ICD-10-CM | POA: Insufficient documentation

## 2019-11-08 DIAGNOSIS — Z7984 Long term (current) use of oral hypoglycemic drugs: Secondary | ICD-10-CM | POA: Insufficient documentation

## 2019-11-08 LAB — BASIC METABOLIC PANEL
Anion gap: 12 (ref 5–15)
BUN: 14 mg/dL (ref 8–23)
CO2: 23 mmol/L (ref 22–32)
Calcium: 10 mg/dL (ref 8.9–10.3)
Chloride: 101 mmol/L (ref 98–111)
Creatinine, Ser: 1.27 mg/dL — ABNORMAL HIGH (ref 0.44–1.00)
GFR, Estimated: 40 mL/min — ABNORMAL LOW (ref >60)
Glucose, Bld: 184 mg/dL — ABNORMAL HIGH (ref 70–99)
Potassium: 4.3 mmol/L (ref 3.5–5.1)
Sodium: 136 mmol/L (ref 135–145)

## 2019-11-08 LAB — CBC
HCT: 35.6 % — ABNORMAL LOW (ref 36.0–46.0)
Hemoglobin: 11.1 g/dL — ABNORMAL LOW (ref 12.0–15.0)
MCH: 29.2 pg (ref 26.0–34.0)
MCHC: 31.2 g/dL (ref 30.0–36.0)
MCV: 93.7 fL (ref 80.0–100.0)
Platelets: 205 10*3/uL (ref 150–400)
RBC: 3.8 MIL/uL — ABNORMAL LOW (ref 3.87–5.11)
RDW: 13.6 % (ref 11.5–15.5)
WBC: 8.8 10*3/uL (ref 4.0–10.5)
nRBC: 0 % (ref 0.0–0.2)

## 2019-11-08 NOTE — ED Triage Notes (Signed)
Pt comes via [facility] from [facility] [place] [facility], pt became dizzy and had a fall, did hit head, no LOC, c/o of back back and weakness in both legs.

## 2019-11-09 ENCOUNTER — Emergency Department (HOSPITAL_COMMUNITY): Payer: Medicare Other

## 2019-11-09 ENCOUNTER — Other Ambulatory Visit: Payer: Self-pay

## 2019-11-09 LAB — RESPIRATORY PANEL BY RT PCR (FLU A&B, COVID)
Influenza A by PCR: NEGATIVE
Influenza B by PCR: NEGATIVE
SARS Coronavirus 2 by RT PCR: NEGATIVE

## 2019-11-09 LAB — URINALYSIS, ROUTINE W REFLEX MICROSCOPIC
Bilirubin Urine: NEGATIVE
Glucose, UA: NEGATIVE mg/dL
Ketones, ur: NEGATIVE mg/dL
Leukocytes,Ua: NEGATIVE
Nitrite: NEGATIVE
Protein, ur: 100 mg/dL — AB
Specific Gravity, Urine: 1.011 (ref 1.005–1.030)
pH: 5 (ref 5.0–8.0)

## 2019-11-09 LAB — CBG MONITORING, ED
Glucose-Capillary: 182 mg/dL — ABNORMAL HIGH (ref 70–99)
Glucose-Capillary: 167 mg/dL — ABNORMAL HIGH (ref 70–99)
Glucose-Capillary: 178 mg/dL — ABNORMAL HIGH (ref 70–99)
Glucose-Capillary: 203 mg/dL — ABNORMAL HIGH (ref 70–99)

## 2019-11-09 LAB — TSH: TSH: 2.276 u[IU]/mL (ref 0.350–4.500)

## 2019-11-09 MED ORDER — INSULIN GLARGINE 100 UNIT/ML ~~LOC~~ SOLN
15.0000 [IU] | Freq: Every day | SUBCUTANEOUS | Status: DC
  Administered 2019-11-09 – 2019-11-10 (×2): 15 [IU] via SUBCUTANEOUS
  Filled 2019-11-09 (×3): qty 0.15

## 2019-11-09 MED ORDER — ASPIRIN EC 81 MG PO TBEC
81.0000 mg | DELAYED_RELEASE_TABLET | Freq: Every day | ORAL | Status: DC
  Administered 2019-11-09 – 2019-11-11 (×3): 81 mg via ORAL
  Filled 2019-11-09 (×3): qty 1

## 2019-11-09 MED ORDER — BASAGLAR KWIKPEN 100 UNIT/ML ~~LOC~~ SOPN
15.0000 [IU] | PEN_INJECTOR | Freq: Every day | SUBCUTANEOUS | Status: DC
  Filled 2019-11-09 (×2): qty 3

## 2019-11-09 MED ORDER — METFORMIN HCL 500 MG PO TABS
1000.0000 mg | ORAL_TABLET | Freq: Two times a day (BID) | ORAL | Status: DC
  Administered 2019-11-09 – 2019-11-11 (×5): 1000 mg via ORAL
  Filled 2019-11-09 (×5): qty 2

## 2019-11-09 MED ORDER — GLIPIZIDE 10 MG PO TABS: 10.0000 mg | ORAL_TABLET | Freq: Two times a day (BID) | ORAL | Status: DC

## 2019-11-09 MED ORDER — SERTRALINE HCL 50 MG PO TABS
50.0000 mg | ORAL_TABLET | Freq: Every day | ORAL | Status: DC
  Administered 2019-11-09 – 2019-11-11 (×3): 50 mg via ORAL
  Filled 2019-11-09 (×4): qty 1

## 2019-11-09 MED ORDER — FERROUS SULFATE 325 (65 FE) MG PO TABS
325.0000 mg | ORAL_TABLET | Freq: Every day | ORAL | Status: DC
  Administered 2019-11-09 – 2019-11-11 (×3): 325 mg via ORAL
  Filled 2019-11-09 (×3): qty 1

## 2019-11-09 MED ORDER — PANTOPRAZOLE SODIUM 40 MG PO TBEC
40.0000 mg | DELAYED_RELEASE_TABLET | Freq: Every day | ORAL | Status: DC
  Administered 2019-11-09 – 2019-11-11 (×3): 40 mg via ORAL
  Filled 2019-11-09 (×3): qty 1

## 2019-11-09 MED ORDER — LISINOPRIL 20 MG PO TABS
20.0000 mg | ORAL_TABLET | Freq: Every day | ORAL | Status: DC
  Administered 2019-11-09 – 2019-11-11 (×3): 20 mg via ORAL
  Filled 2019-11-09 (×3): qty 1

## 2019-11-09 MED ORDER — SIMVASTATIN 20 MG PO TABS
20.0000 mg | ORAL_TABLET | Freq: Every evening | ORAL | Status: DC
  Administered 2019-11-09 – 2019-11-10 (×2): 20 mg via ORAL
  Filled 2019-11-09 (×2): qty 1

## 2019-11-09 NOTE — NC FL2 (Signed)
[place] LEVEL OF CARE SCREENING TOOL     IDENTIFICATION  Patient Name: Laura Winters Birthdate: Jun 09, 1940 Sex: female Admission Date (Current Location): 11/08/2019  [facility] and [place] Number:  Herbalist and Address:  [facility], [place] [address], [place], [place] [address]      Provider Number: [id number]  Attending Physician Name and Address:  Tegeler, Gwenyth Allegra, *  Relative Name and Phone Number:       Current Level of Care: Hospital Recommended Level of Care: [place] Prior Approval Number:    Date Approved/Denied:   PASRR Number: [id number]  Discharge Plan: SNF    Current Diagnoses: Patient Active Problem List   Diagnosis Date Noted  . Hyperglycemia   . Symptomatic anemia 01/30/2019    Orientation RESPIRATION BLADDER Height & Weight     Self, Time, Situation, Place  Normal Continent (Wears briefs) Weight: 130 lb (59 kg) Height:  5\' 1"  (154.9 cm)  BEHAVIORAL SYMPTOMS/MOOD NEUROLOGICAL BOWEL NUTRITION STATUS      Continent Diet (Normal)  AMBULATORY STATUS COMMUNICATION OF NEEDS Skin   Limited Assist Verbally Normal                       Personal Care Assistance Level of Assistance  Feeding, Bathing, Dressing Bathing Assistance: Limited assistance Feeding assistance: Independent Dressing Assistance: Limited assistance     Functional Limitations Info  Sight, Hearing, Speech Sight Info: Adequate Hearing Info: Adequate Speech Info: Adequate    SPECIAL CARE FACTORS FREQUENCY  PT (By licensed PT), OT (By licensed OT)     PT Frequency: 5x weekly OT Frequency: 5x weekly            Contractures Contractures Info: Present    Additional Factors Info  Allergies   Allergies Info: Penicillins           Current Medications (11/09/2019):  This is the current hospital active medication list Current Facility-Administered Medications  Medication Dose Route Frequency  Provider Last Rate Last Admin  . 0.9 %  sodium chloride infusion  500 mL Intravenous Once Irene Shipper, MD      . aspirin EC tablet 81 mg  81 mg Oral Daily Launa Flight, MD   81 mg at 11/09/19 1339  . Basaglar KwikPen KwikPen 15 Units  15 Units Subcutaneous Q2200 Launa Flight, MD      . ferrous sulfate tablet 325 mg  325 mg Oral Daily Launa Flight, MD   325 mg at 11/09/19 1339  . lisinopril (ZESTRIL) tablet 20 mg  20 mg Oral Daily Launa Flight, MD   20 mg at 11/09/19 1339  . metFORMIN (GLUCOPHAGE) tablet 1,000 mg  1,000 mg Oral BID Launa Flight, MD   1,000 mg at 11/09/19 1350  . pantoprazole (PROTONIX) EC tablet 40 mg  40 mg Oral Daily Launa Flight, MD   40 mg at 11/09/19 1339  . sertraline (ZOLOFT) tablet 50 mg  50 mg Oral Daily Launa Flight, MD      . simvastatin (ZOCOR) tablet 20 mg  20 mg Oral QPM Launa Flight, MD       Current Outpatient Medications  Medication Sig Dispense Refill  . aspirin EC 81 MG tablet Take 81 mg by mouth daily.    . Cholecalciferol (VITAMIN D3 PO) Take 1 tablet by mouth daily.     . Cyanocobalamin (VITAMIN B12 PO) Take 2 tablets by mouth daily.    . ferrous sulfate  325 (65 FE) MG tablet Take 1 tablet (325 mg total) by mouth daily. 30 tablet 3  . glipiZIDE (GLUCOTROL) 10 MG tablet Take 10 mg by mouth 2 (two) times daily.     . Insulin Glargine (BASAGLAR KWIKPEN) 100 UNIT/ML SOPN SMARTSIG:15 Unit(s) SUB-Q Every Night    . lisinopril (ZESTRIL) 20 MG tablet Take 20 mg by mouth daily.    . metFORMIN (GLUCOPHAGE) 1000 MG tablet Take 1,000 mg by mouth 2 (two) times daily.    . Na Sulfate-K Sulfate-Mg Sulf 17.5-3.13-1.6 GM/177ML SOLN Suprep (no substitutions)-TAKE AS DIRECTED. Lot [id number], exp 10/22 354 mL 0  . omeprazole (PRILOSEC) 20 MG capsule Take 1 capsule (20 mg total) by mouth daily. 30 capsule 11  . sertraline (ZOLOFT) 25 MG tablet Take 25 mg by mouth daily.    . simvastatin (ZOCOR) 20 MG tablet Take 20 mg by mouth every evening.        Discharge Medications: Please see discharge summary for a list of discharge medications.  Relevant Imaging Results:  Relevant Lab Results:   Additional Information SSN: [id number]  Archie Endo, LCSW

## 2019-11-09 NOTE — ED Provider Notes (Signed)
[facility] EMERGENCY DEPARTMENT Provider Note   CSN: [id number] Arrival date & time: 11/08/19  2045     History Chief Complaint  Patient presents with   Dizziness    Laura Winters is a 79 y.o. female.  The patient reports that she has had many falls since completing physical therapy last week, including as recently as tonight while she was in the waiting room and using the restroom. She denies LOC.   The history is provided by the patient.  Fall This is a recurrent problem. Episode onset: 1 week ago. Episode frequency: several times a day. The problem has been rapidly worsening. Pertinent negatives include no chest pain, no abdominal pain and no shortness of breath. Nothing aggravates the symptoms. Nothing relieves the symptoms. She has tried nothing for the symptoms.       Past Medical History:  Diagnosis Date   Anemia    Depression    DM (diabetes mellitus) ([place])    type 2   Hyperlipidemia    Uterine cancer [facility]) age 82's    Patient Active Problem List   Diagnosis Date Noted   Hyperglycemia    Symptomatic anemia 01/30/2019    Past Surgical History:  Procedure Laterality Date   ABDOMINAL HYSTERECTOMY     total , due to uterine cancer   APPENDECTOMY     COLONOSCOPY  8 years ago   in [place]-normal exam   GALLBLADDER SURGERY       OB History   No obstetric history on file.     Family History  Problem Relation Age of Onset   Goiter Mother    Skin cancer Brother    Colon cancer Neg Hx    Esophageal cancer Neg Hx    Rectal cancer Neg Hx    Stomach cancer Neg Hx    Colon polyps Neg Hx     Social History   Tobacco Use   Smoking status: Current Some Day Smoker    Types: Cigarettes   Smokeless tobacco: Never Used   Tobacco comment: tobacco info given  Vaping Use   Vaping Use: Never used  Substance Use Topics   Alcohol use: Yes    Comment: occ-once a month   Drug use: Yes    Frequency: 1.0 times  per week    Types: Marijuana    Home Medications Prior to Admission medications   Medication Sig Start Date End Date Taking? Authorizing Provider  aspirin EC 81 MG tablet Take 81 mg by mouth daily.    [provider]  Cholecalciferol (VITAMIN D3 PO) Take 1 tablet by mouth daily.     [provider]  Cyanocobalamin (VITAMIN B12 PO) Take 2 tablets by mouth daily.    [provider]  ferrous sulfate 325 (65 FE) MG tablet Take 1 tablet (325 mg total) by mouth daily. 01/31/19 01/31/20  Al Decant, MD  glipiZIDE (GLUCOTROL) 10 MG tablet Take 10 mg by mouth 2 (two) times daily.  01/26/19   [provider]  Insulin Glargine (BASAGLAR KWIKPEN) 100 UNIT/ML SOPN SMARTSIG:15 Unit(s) SUB-Q Every Night 02/02/19   [provider]  lisinopril (ZESTRIL) 20 MG tablet Take 20 mg by mouth daily. 01/27/19   [provider]  metFORMIN (GLUCOPHAGE) 1000 MG tablet Take 1,000 mg by mouth 2 (two) times daily. 01/26/19   [provider]  Na Sulfate-K Sulfate-Mg Sulf 17.5-3.13-1.6 GM/177ML SOLN Suprep (no substitutions)-TAKE AS DIRECTED. Lot [id number], exp 10/22 02/16/19   Irene Shipper, MD  omeprazole (PRILOSEC) 20 MG capsule Take 1 capsule (20 mg total) by mouth daily. 02/26/19   Irene Shipper, MD  sertraline (ZOLOFT) 25 MG tablet Take 25 mg by mouth daily. 01/26/19   [provider]  simvastatin (ZOCOR) 20 MG tablet Take 20 mg by mouth every evening. 01/27/19   [provider]    Allergies    Penicillins  Review of Systems   Review of Systems  Constitutional: Positive for fatigue. Negative for chills and fever.  HENT: Negative for ear pain and sore throat.   Eyes: Negative for pain and visual disturbance.  Respiratory: Negative for cough and shortness of breath.   Cardiovascular: Negative for chest pain and palpitations.  Gastrointestinal: Negative for abdominal pain and vomiting.  Genitourinary: Negative for dysuria and hematuria.    Musculoskeletal: Negative for arthralgias and back pain.  Skin: Negative for color change and rash.  Neurological: Positive for dizziness, tremors and weakness. Negative for seizures and syncope.  All other systems reviewed and are negative.   Physical Exam Updated Vital Signs BP 115/69 (BP Location: Right Arm)    Pulse 97    Temp 98 F (36.7 C) (Oral)    Resp (!) 24    Ht 5\' 1"  (1.549 m)    Wt 59 kg    SpO2 95%    BMI 24.56 kg/m   Physical Exam Vitals and nursing note reviewed.  Constitutional:      Appearance: She is well-developed. She is not ill-appearing, toxic-appearing or diaphoretic.  HENT:     Head: Normocephalic and atraumatic.  Eyes:     Conjunctiva/sclera: Conjunctivae normal.  Cardiovascular:     Rate and Rhythm: Regular rhythm. Tachycardia present.     Heart sounds: No murmur heard.  No gallop.   Pulmonary:     Effort: Pulmonary effort is normal. No respiratory distress.     Breath sounds: Normal breath sounds.  Abdominal:     Palpations: Abdomen is soft.     Tenderness: There is no abdominal tenderness.  Musculoskeletal:     Cervical back: Neck supple.  Skin:    General: Skin is warm and dry.  Neurological:     Mental Status: She is alert.     Comments: Mental status: alert and oriented to person, place, time, situation. Speech: Speech is clear and language is aphasic Fund of knowledge: Intact  Cranial Nerves:  II: Intact to confrontation bilaterally III, IV, VI: EOMI, no nystagmus V: face sensation intact, good masseter strength VII: no facial droop or weakness VIII: gross hearing intact bilaterally IX/XI: palate elevates symmetrically XII: tongue protrudes symmetrically, no deviation  Strength: 5/5 and symmetric in BUE and BLE. No pronation or drift. Tone: normal tone, no tremors Coordination: Intact finger to nose and heel to shin. Sensation: intact to light touch in all extremities.  [place]berg unable to be assessed.  Gait: Unable to even stand  without assistance     ED Results / Procedures / Treatments   Labs (all labs ordered are listed, but only abnormal results are displayed) Labs Reviewed  BASIC METABOLIC PANEL - Abnormal; Notable for the following components:      Result Value   Glucose, Bld 184 (*)    Creatinine, Ser 1.27 (*)    GFR, Estimated 40 (*)    All other components within normal limits  CBC - Abnormal; Notable for the following components:   RBC 3.80 (*)    Hemoglobin 11.1 (*)    HCT 35.6 (*)  All other components within normal limits  URINALYSIS, ROUTINE W REFLEX MICROSCOPIC - Abnormal; Notable for the following components:   Hgb urine dipstick SMALL (*)    Protein, ur 100 (*)    Bacteria, UA RARE (*)    All other components within normal limits  CBG MONITORING, ED - Abnormal; Notable for the following components:   Glucose-Capillary 182 (*)    All other components within normal limits  CBG MONITORING, ED - Abnormal; Notable for the following components:   Glucose-Capillary 167 (*)    All other components within normal limits  RESPIRATORY PANEL BY RT PCR (FLU A&B, COVID)  TSH  CBG MONITORING, ED    EKG EKG Interpretation  Date/Time:  Sunday November 08 2019 20:55:57 EDT Ventricular Rate:  92 PR Interval:  144 QRS Duration: 74 QT Interval:  264 QTC Calculation: 326 R Axis:   -44 Text Interpretation: Sinus rhythm with Premature supraventricular complexes Left axis deviation ST & T wave abnormality, consider lateral ischemia Abnormal ECG Baseline wander When compared with ECG of 01/30/2019, No significant change was found Confirmed by Delora Fuel ([id number]) on 11/08/2019 11:32:06 PM   Radiology CT Head Wo Contrast  Result Date: 11/09/2019 CLINICAL DATA:  Head trauma, minor.  Dizziness, nonspecific. EXAM: CT HEAD WITHOUT CONTRAST TECHNIQUE: Contiguous axial images were obtained from the base of the skull through the vertex without intravenous contrast. COMPARISON:  No pertinent prior exams are  available for comparison. FINDINGS: Brain: Mild generalized cerebral atrophy. Mild ill-defined hypoattenuation within the cerebral white matter is nonspecific, but consistent with chronic small vessel ischemic disease. Prominent perivascular space versus chronic lacunar infarct within the right lentiform nucleus (series 3, image 14). Rounded dural-based calcified focus overlying the right frontal lobe measuring 1.9 x 1.5 cm in transaxial dimensions likely reflecting an incidental meningioma (series 4, image 51). Mild mass effect upon the underlying right frontal lobe. There is no acute intracranial hemorrhage. No demarcated cortical infarct. No extra-axial fluid collection. No midline shift. Vascular: No hyperdense vessel.  Atherosclerotic calcifications. Skull: Normal. Negative for fracture or focal lesion. Sinuses/Orbits: Visualized orbits show no acute finding. Trace ethmoid sinus mucosal thickening. No significant mastoid effusion at the imaged levels. IMPRESSION: No acute posttraumatic intracranial findings. Incidentally noted 1.9 x 1.5 cm meningioma overlying the right frontal lobe. Mild localized mass effect upon the underlying right frontal lobe parenchyma. No midline shift. Mild generalized cerebral atrophy and chronic small vessel ischemic disease. Prominent perivascular space versus chronic lacunar infarct within the right basal ganglia. Electronically Signed   By: Kellie Simmering DO   On: 11/09/2019 09:11   DG Chest Portable 1 View  Result Date: 11/09/2019 CLINICAL DATA:  Shortness of breath.  Fall. EXAM: PORTABLE CHEST 1 VIEW COMPARISON:  01/30/2019 FINDINGS: The cardiac silhouette is mildly enlarged. No airspace consolidation, edema, pleural effusion, or pneumothorax is identified. No acute osseous abnormality is seen. Calcification is again seen adjacent to the left humeral head suggesting calcific rotator cuff tendinopathy. IMPRESSION: No active disease. Electronically Signed   By: Logan Bores  M.D.   On: 11/09/2019 08:25    Procedures Procedures (including critical care time)  Medications Ordered in ED Medications  aspirin EC tablet 81 mg (81 mg Oral Given 11/09/19 1339)  ferrous sulfate tablet 325 mg (325 mg Oral Given 11/09/19 1339)  [employer] KwikPen 15 Units (has no administration in time range)  lisinopril (ZESTRIL) tablet 20 mg (20 mg Oral Given 11/09/19 1339)  metFORMIN (GLUCOPHAGE) tablet 1,000 mg (1,000 mg Oral  Given 11/09/19 1350)  pantoprazole (PROTONIX) EC tablet 40 mg (40 mg Oral Given 11/09/19 1339)  sertraline (ZOLOFT) tablet 50 mg (has no administration in time range)  simvastatin (ZOCOR) tablet 20 mg (has no administration in time range)    ED Course  I have reviewed the triage vital signs and the nursing notes.  Pertinent labs & imaging results that were available during my care of the patient were reviewed by me and considered in my medical decision making (see chart for details).    MDM Rules/Calculators/A&P                          The patient is a 79yo female, PMH DM2, here with dizziness, tremors, BLE weakness, multiple falls for last 1 week.  On my initial evaluation, she is mildly tachycardic but otherwise hemodynamically stable, afebrile, nontoxic appearing. Physical exam remarkable for tremors in all extremities, no focal weakness but inability to stand or ambulate without assistance.   Differentials considered include CVA, SDH, hyperglycemia, UTI, electrolyte abnormality. Findings on neuro exam are nonfocal, doubt CVA. Suspect patient is ultimately just deconditioning. She lives at independent living facility and is currently unable to stand without 3 person assist, so she will require placement at higher level of care facility.   Labs unremarkable, CT head with incidental meningioma, CXR unremarkable. Placed consults to PT and care coordination because patient is too unsteady and not safe for discharge home and will require placement.   PT recommended 24-hour SNF and case management is beginning to seek placement.  Due to likely prolonged stay in the ED while awaiting placement, home medications ordered.  Covid test, which was ordered in anticipation of need for SNF placement, was negative.  Care of patient transferred to Dr. Laverta Baltimore at 1500.  Plan of care communicated, which is that patient is pending placement at SNF.  Patient stable condition at time of transfer.  The care of this patient was overseen by Dr. Sherry Ruffing, ED attending, who agreed with the assessment and care plan.   Final Clinical Impression(s) / ED Diagnoses Final diagnoses:  Weakness  Frequent falls    Rx / DC Orders ED Discharge Orders    None       Launa Flight, MD 11/09/19 1531    Tegeler, Gwenyth Allegra, MD 11/10/19 1404

## 2019-11-09 NOTE — Evaluation (Signed)
Physical Therapy Evaluation Patient Details Name: Laura Winters MRN: [id number] DOB: 1940/09/06 Today's Date: 11/09/2019   History of Present Illness  Pt is a 79 y/o female admitted secondary to multiple falls and dizziness. Incidental finding of R frontal lobe meningioma. PMH includes DM and uterine cancer  Clinical Impression  Pt presenting secondary to problem above with deficits below. Pt requiring mod A to sit at EOB. Attempted to stand from stretcher, however, pt's feet sliding forwards and started sliding forwards on stretcher. Required max A to return to supine. Will need +2 to attempt further mobility. Pt currently living alone at ILF. Feel pt will require SNF level therapies at d/c. Will continue to follow acutely to maximize functional mobility independence and safety.     Follow Up Recommendations SNF;Supervision/Assistance - 24 hour    Equipment Recommendations  None recommended by PT    Recommendations for Other Services       Precautions / Restrictions Precautions Precautions: Fall Precaution Comments: Multiple falls at home Restrictions Weight Bearing Restrictions: No      Mobility  Bed Mobility Overal bed mobility: Needs Assistance Bed Mobility: Supine to Sit;Sit to Supine     Supine to sit: Mod assist Sit to supine: Max assist   General bed mobility comments: Mod A for trunk elevation. Pt had to be assisted back into supine as she started sliding off stretcher when attempting to stand. Max A for LE and trunk assist.   Transfers                 General transfer comment: Attempted to stand, however, pts feet sliding forward and pt started sliding off stretcher. Had to assist pt with return to supine to prevent sliding off stretcher.   Ambulation/Gait                Stairs            Wheelchair Mobility    Modified Rankin (Stroke Patients Only)       Balance Overall balance assessment: Needs assistance Sitting-balance support:  Bilateral upper extremity supported;Feet unsupported Sitting balance-Leahy Scale: Poor Sitting balance - Comments: Reliant on BUE support    Standing balance support: Bilateral upper extremity supported Standing balance-Leahy Scale: Zero                               Pertinent Vitals/Pain Pain Assessment: Faces Faces Pain Scale: Hurts a little bit Pain Location: back Pain Descriptors / Indicators: Aching Pain Intervention(s): Limited activity within patient's tolerance;Monitored during session;Repositioned    Home Living Family/patient expects to be discharged to:: Private residence Living Arrangements: Alone Available Help at Discharge: Family;Available PRN/intermittently Type of Home: Independent living facility Home Access: Level entry     Home Layout: One level Home Equipment: Walker - 2 wheels      Prior Function Level of Independence: Independent with assistive device(s)         Comments: Was using RW.      Hand Dominance        Extremity/Trunk Assessment   Upper Extremity Assessment Upper Extremity Assessment: Generalized weakness    Lower Extremity Assessment Lower Extremity Assessment: Generalized weakness    Cervical / Trunk Assessment Cervical / Trunk Assessment: Normal  Communication   Communication: HOH  Cognition Arousal/Alertness: Awake/alert Behavior During Therapy: WFL for tasks assessed/performed Overall Cognitive Status: No family/caregiver present to determine baseline cognitive functioning  General Comments: Pt reporting conflicting information throughout. Unsure of baseline.       General Comments      Exercises     Assessment/Plan    PT Assessment Patient needs continued PT services  PT Problem List Decreased strength;Decreased balance;Decreased mobility;Decreased cognition;Decreased safety awareness;Decreased knowledge of precautions       PT Treatment  Interventions DME instruction;Gait training;Functional mobility training;Therapeutic activities;Therapeutic exercise;Balance training;Patient/family education    PT Goals (Current goals can be found in the Care Plan section)  Acute Rehab PT Goals Patient Stated Goal: to stop falling  PT Goal Formulation: With patient Time For Goal Achievement: 11/23/19 Potential to Achieve Goals: Good    Frequency Min 2X/week   Barriers to discharge Decreased caregiver support      Co-evaluation               AM-PAC PT "6 Clicks" Mobility  Outcome Measure Help needed turning from your back to your side while in a flat bed without using bedrails?: A Little Help needed moving from lying on your back to sitting on the side of a flat bed without using bedrails?: A Lot Help needed moving to and from a bed to a chair (including a wheelchair)?: Total Help needed standing up from a chair using your arms (e.g., wheelchair or bedside chair)?: Total Help needed to walk in hospital room?: Total Help needed climbing 3-5 steps with a railing? : Total 6 Click Score: 9    End of Session Equipment Utilized During Treatment: Gait belt Activity Tolerance: Patient tolerated treatment well Patient left: in bed;with call bell/phone within reach (on stretcher in ED ) Nurse Communication: Mobility status PT Visit Diagnosis: Unsteadiness on feet (R26.81);Repeated falls (R29.6);Muscle weakness (generalized) (M62.81);History of falling (Z91.81)    Time: 8299-3716 PT Time Calculation (min) (ACUTE ONLY): 17 min   Charges:   PT Evaluation $PT Eval Moderate Complexity: 1 Mod          Reuel Derby, PT, DPT  Acute Rehabilitation Services  Pager: [phone] Office: [phone]   Rudean Hitt 11/09/2019, 1:30 PM

## 2019-11-09 NOTE — ED Notes (Signed)
Bed ordered

## 2019-11-09 NOTE — ED Notes (Signed)
Spoke on the phone with pts sister and son, both were given an update.

## 2019-11-09 NOTE — Progress Notes (Signed)
CSW met with Pt at bedside, completed TOC assessment. CSW faxed out referrals to area SNFs for placement. CSW updated son, Savannah Morford $RemoveBeforeDE'@828'[id number]$ [phone] per request of the Pt.

## 2019-11-09 NOTE — Discharge Instructions (Addendum)
Patient ready for discharge and transfer to [facility] facility.

## 2019-11-09 NOTE — Progress Notes (Signed)
This patient is from [facility] attempted to speak with staff at the facility, however no answer so a voicemail was left requesting a return call.  Madilyn Fireman, MSW, LCSW-A Transitions of Care  Clinical Social Worker  [facility] Emergency Departments  Medical ICU [phone]

## 2019-11-09 NOTE — ED Notes (Signed)
Yellow arm band and socks placed on pt.

## 2019-11-10 LAB — CBG MONITORING, ED
Glucose-Capillary: 187 mg/dL — ABNORMAL HIGH (ref 70–99)
Glucose-Capillary: 120 mg/dL — ABNORMAL HIGH (ref 70–99)
Glucose-Capillary: 164 mg/dL — ABNORMAL HIGH (ref 70–99)
Glucose-Capillary: 200 mg/dL — ABNORMAL HIGH (ref 70–99)

## 2019-11-10 NOTE — ED Notes (Signed)
Checked on Pt. She was resting well with normal breathing pattern

## 2019-11-10 NOTE — Progress Notes (Addendum)
2:20pm: CSW received return call from [facility] stating he wants to accept the bed offer from [facility].   CSW spoke with Juliann Pulse at [facility] to inform her of decision. Juliann Pulse states the patient can be accepted into the facility tomorrow.  10:15am: CSW spoke with patient's son Randall Hiss to present him with bed offers - Randall Hiss to review list and return call to [facility] with a decision.  Madilyn Fireman, MSW, LCSW-A Transitions of Care  Clinical Social Worker  [facility] Emergency Departments  Medical ICU [phone]

## 2019-11-10 NOTE — ED Notes (Signed)
Spoke to pt and she stated that she was doing fine. Her son stated that she should be placed sometime tommorrow

## 2019-11-10 NOTE — ED Notes (Signed)
Visually checked on Pt. Pt was resting well normal breathing patterb

## 2019-11-11 LAB — RESPIRATORY PANEL BY RT PCR (FLU A&B, COVID)
Influenza A by PCR: NEGATIVE
Influenza B by PCR: NEGATIVE
SARS Coronavirus 2 by RT PCR: NEGATIVE

## 2019-11-11 MED ORDER — IBUPROFEN 400 MG PO TABS
600.0000 mg | ORAL_TABLET | Freq: Once | ORAL | Status: AC
  Administered 2019-11-11: 600 mg via ORAL
  Filled 2019-11-11: qty 1

## 2019-11-11 NOTE — ED Notes (Signed)
Ordered breakfast 

## 2019-11-11 NOTE — ED Notes (Signed)
Patient verbalizes understanding of discharge instructions. Opportunity for questioning and answers were provided. Armband removed by staff, pt discharged from ED via wheelchair.  

## 2019-11-11 NOTE — Progress Notes (Addendum)
12:30pm: CSW sent patient's negative COVID result and AVS over to [facility] for review.   The patient will go to room 101B at [facility]. The number to call for report is [phone]. Patient will be discharged and picked up by her son who will provide transport to the facility at 2:30pm today.  CSW spoke with patient's son Randall Hiss to inform him of discharge plan.  10:15am: CSW was notified by Juliann Pulse that this patient needs an additional COVID test before being transferred to the facility.  8am: CSW received patient's COVID vaccine card from her son Randall Hiss, CSW will have copy of card uploaded into chart.  CSW sent patient's COVID card over to [place] at [employer].  Madilyn Fireman, MSW, LCSW-A Transitions of Care  Clinical Social Worker  [facility]  Medical ICU [phone]

## 2019-12-10 ENCOUNTER — Encounter: Payer: Self-pay | Admitting: Neurology

## 2020-01-13 ENCOUNTER — Ambulatory Visit: Payer: Medicare Other | Admitting: Neurology

## 2020-03-07 NOTE — Progress Notes (Signed)
NEUROLOGY CONSULTATION NOTE  Laura Winters MRN: [id number] DOB: [birth year]  Referring provider: Buzzy Han, MD Primary care provider: Buzzy Han, MD  Reason for consult:  Unsteady gait, dementia, meningioma   Subjective:  Laura Winters is a 80 year old right-handed female with type 2 diabetes mellitus, anemia and history of uterine cancer who presents for unsteady gait, dementia and meningioma.  History supplemented by ED and referring provider's notes.  She has had progressively worse balance with frequent falls over the past 2 years.  She says that she gets dizzy and loses her balance.  No spinning or lightheadedness.  She does report weakness in her legs but denies lower extremity pain or numbness.  Has mild neck soreness but denies back pain.  Sometimes her right arm goes numb in the middle of the night.  But denies upper extremity weakness or numbness.  She went to physical therapy where she was told that she had mild dementia.  PCP started her on donepezil.  Despite completing physical therapy, she continued to have multiple falls.  She went to the ED on 11/08/2019 where CT head was performed, which was personally reviewed and showed 1.9 x 1.5 cm meningioma over the right frontal lobe with mild localized mass effect but no midline shift.    No headaches or history of seizures.  Patient lives alone.  She does not drive and requires transportation.  She cooks and manages her finances independently.  She uses a pillbox to help remember medications.       PAST MEDICAL HISTORY: Past Medical History:  Diagnosis Date  . Anemia   . Depression   . DM (diabetes mellitus) ([place])    type 2  . Hyperlipidemia   . Uterine cancer [facility]) age 84's    PAST SURGICAL HISTORY: Past Surgical History:  Procedure Laterality Date  . ABDOMINAL HYSTERECTOMY     total , due to uterine cancer  . APPENDECTOMY    . COLONOSCOPY  8 years ago   in [place]normal exam  .  GALLBLADDER SURGERY      MEDICATIONS: Current Outpatient Medications on File Prior to Visit  Medication Sig Dispense Refill  . aspirin EC 81 MG tablet Take 81 mg by mouth daily. (Patient not taking: Reported on 11/09/2019)    . Cholecalciferol (VITAMIN D3 PO) Take 1 tablet by mouth daily.  (Patient not taking: Reported on 11/09/2019)    . Cyanocobalamin (VITAMIN B12 PO) Take 2 tablets by mouth daily. (Patient not taking: Reported on 11/09/2019)    . ferrous sulfate 325 (65 FE) MG tablet Take 1 tablet (325 mg total) by mouth daily. 30 tablet 3  . glipiZIDE (GLUCOTROL) 10 MG tablet Take 10 mg by mouth 2 (two) times daily.  (Patient not taking: Reported on 11/09/2019)    . lisinopril (ZESTRIL) 20 MG tablet Take 20 mg by mouth daily.    . metFORMIN (GLUCOPHAGE) 1000 MG tablet Take 2,000 mg by mouth 2 (two) times daily.     . Na Sulfate-K Sulfate-Mg Sulf 17.5-3.13-1.6 GM/177ML SOLN Suprep (no substitutions)-TAKE AS DIRECTED. Lot [id number], exp 10/22 354 mL 0  . omeprazole (PRILOSEC) 20 MG capsule Take 1 capsule (20 mg total) by mouth daily. (Patient taking differently: Take 20 mg by mouth 2 (two) times daily before a meal. ) 30 capsule 11  . sertraline (ZOLOFT) 50 MG tablet Take 50 mg by mouth daily.    . simvastatin (ZOCOR) 20 MG tablet Take 20 mg by mouth every evening.  Current Facility-Administered Medications on File Prior to Visit  Medication Dose Route Frequency Provider Last Rate Last Admin  . 0.9 %  sodium chloride infusion  500 mL Intravenous Once Irene Shipper, MD        ALLERGIES: Allergies  Allergen Reactions  . Penicillins Swelling and Rash    Did it involve swelling of the face/tongue/throat, SOB, or low BP? Yes Did it involve sudden or severe rash/hives, skin peeling, or any reaction on the inside of your mouth or nose? Yes Did you need to seek medical attention at a hospital or doctor's office? Yes When did it last happen?Childhood If all above answers are "NO", may  proceed with cephalosporin use.     FAMILY HISTORY: Family History  Problem Relation Age of Onset  . Goiter Mother   . Skin cancer Brother   . Colon cancer Neg Hx   . Esophageal cancer Neg Hx   . Rectal cancer Neg Hx   . Stomach cancer Neg Hx   . Colon polyps Neg Hx     SOCIAL HISTORY: Social History   Socioeconomic History  . Marital status: Widowed    Spouse name: Not on file  . Number of children: 1  . Years of education: Not on file  . Highest education level: Not on file  Occupational History  . Occupation: retired  Tobacco Use  . Smoking status: Current Some Day Smoker    Types: Cigarettes  . Smokeless tobacco: Never Used  . Tobacco comment: tobacco info given  Vaping Use  . Vaping Use: Never used  Substance and Sexual Activity  . Alcohol use: Yes    Comment: occ-once a month  . Drug use: Yes    Frequency: 1.0 times per week    Types: Marijuana  . Sexual activity: Not on file  Other Topics Concern  . Not on file  Social History Narrative  . Not on file   Social Determinants of Health   Financial Resource Strain: Not on file  Food Insecurity: Not on file  Transportation Needs: Not on file  Physical Activity: Not on file  Stress: Not on file  Social Connections: Not on file  Intimate Partner Violence: Not on file    Objective:  Blood pressure (!) 144/78, pulse 100, resp. rate 18, height 5' (1.524 m), weight 126 lb (57.2 kg), SpO2 (!) 75 %. General: No acute distress.  Patient appears well-groomed.   Head:  Normocephalic/atraumatic Eyes:  fundi examined but not visualized Neck: supple, no paraspinal tenderness, full range of motion Back: No paraspinal tenderness Heart: regular rate and rhythm Lungs: Clear to auscultation bilaterally. Vascular: No carotid bruits. Neurological Exam: Mental status:  [employer] Mental Exam 03/08/2020  Weekday Correct 1  Current year 1  What state are we in? 1  Amount spent 1  Amount left 0  # of  Animals 2  5 objects recall 2  Number series 2  Hour markers 0  Time correct 0  Placed X in triangle correctly 1  Largest Figure 1  Name of female 2  Date back to work 0  Type of work 2  State she lived in 2  Total score 18   speech fluent and not dysarthric, language intact. Cranial nerves: CN I: not tested CN II: pupils equal, round and reactive to light, visual fields intact CN III, IV, VI:  full range of motion, no nystagmus, no ptosis CN V: facial sensation intact. CN VII: upper and lower face symmetric  CN VIII: hearing intact CN IX, X: gag intact, uvula midline CN XI: sternocleidomastoid and trapezius muscles intact CN XII: tongue midline Bulk & Tone: normal, no fasciculations. Motor:  muscle strength 5/5 throughout Sensation:  Pinprick, temperature and vibratory sensation intact. Deep Tendon Reflexes:  3+ throughout,  Hoffman sign absent, toes downgoing.   Finger to nose testing:  Without dysmetria.   Heel to shin:  Without dysmetria.   Gait:  cautious mildly wide-based slow gait, unsteady.  Romberg with sway.  Assessment/Plan:   1.  Cerebral meningioma - incidental finding.  Will monitor. 2.  Unsteady gait - no evidence of NPH, Parkinson's, weakness or significant sensory loss.  She has brisk reflexes but no definite pathologic reflexes.  This may indicate myelopathy due to cervical spinal stenosis so we will check MRI of cervical spine. 3.  Mild neurocognitive disorder - started on donepezil by PCP  1.  We will repeat CT of head without contrast in one year. 2.  Follow up after repeat CT.  We can reassess memory and gait problems at that time.  Otherwise, follow up sooner if needed.  Thank you for allowing me to take part in the care of this patient.  Metta Clines, DO  CC:  Buzzy Han, MD

## 2020-03-08 ENCOUNTER — Encounter: Payer: Self-pay | Admitting: Neurology

## 2020-03-08 ENCOUNTER — Other Ambulatory Visit: Payer: Self-pay

## 2020-03-08 ENCOUNTER — Ambulatory Visit (INDEPENDENT_AMBULATORY_CARE_PROVIDER_SITE_OTHER): Payer: Medicare Other | Admitting: Neurology

## 2020-03-08 VITALS — BP 144/78 | HR 100 | Resp 18 | Ht 60.0 in | Wt 126.0 lb

## 2020-03-08 DIAGNOSIS — R292 Abnormal reflex: Secondary | ICD-10-CM

## 2020-03-08 DIAGNOSIS — D320 Benign neoplasm of cerebral meninges: Secondary | ICD-10-CM

## 2020-03-08 DIAGNOSIS — R2681 Unsteadiness on feet: Secondary | ICD-10-CM | POA: Diagnosis not present

## 2020-03-08 DIAGNOSIS — R296 Repeated falls: Secondary | ICD-10-CM | POA: Diagnosis not present

## 2020-03-08 DIAGNOSIS — G3184 Mild cognitive impairment, so stated: Secondary | ICD-10-CM

## 2020-03-08 NOTE — Patient Instructions (Signed)
1.  Check MRI of cervical spine without contrast in open MRI at [facility].  2.  Repeat CT head without contrast in one year 3.  Follow up after repeat CT of head in one year

## 2020-03-15 ENCOUNTER — Other Ambulatory Visit: Payer: Self-pay | Admitting: Internal Medicine

## 2020-03-15 DIAGNOSIS — D509 Iron deficiency anemia, unspecified: Secondary | ICD-10-CM

## 2020-08-18 ENCOUNTER — Other Ambulatory Visit: Payer: Self-pay | Admitting: Internal Medicine

## 2020-08-18 DIAGNOSIS — D509 Iron deficiency anemia, unspecified: Secondary | ICD-10-CM

## 2021-02-13 ENCOUNTER — Other Ambulatory Visit: Payer: Self-pay | Admitting: Family Medicine

## 2021-02-13 DIAGNOSIS — E278 Other specified disorders of adrenal gland: Secondary | ICD-10-CM

## 2021-03-06 NOTE — Progress Notes (Unsigned)
NEUROLOGY FOLLOW UP OFFICE NOTE  Laura Winters [id number]  Assessment/Plan:   ***  Subjective:  Laura Winters is an 81 year old right-handed female with type 2 diabetes mellitus, anemia and history of uterine cancer who follows up for neurocognitive disorder and meningioma.   UPDATE: ***  HISTORY: She has had progressively worse balance with frequent falls over the past 2 years.  She says that she gets dizzy and loses her balance.  No spinning or lightheadedness.  She does report weakness in her legs but denies lower extremity pain or numbness.  Has mild neck soreness but denies back pain.  Sometimes her right arm goes numb in the middle of the night.  But denies upper extremity weakness or numbness.  She went to physical therapy where she was told that she had mild dementia.  PCP started her on donepezil.  Despite completing physical therapy, she continued to have multiple falls.  She went to the ED on 11/08/2019 where CT head was performed, which was personally reviewed and showed 1.9 x 1.5 cm meningioma over the right frontal lobe with mild localized mass effect but no midline shift.     No headaches or history of seizures.  Patient lives alone.  She does not drive and requires transportation.  She cooks and manages her finances independently.  She uses a pillbox to help remember medications.    PAST MEDICAL HISTORY: Past Medical History:  Diagnosis Date   Anemia    Depression    DM (diabetes mellitus) ([place])    type 2   Hyperlipidemia    Uterine cancer [facility]) age 65's    MEDICATIONS: Current Outpatient Medications on File Prior to Visit  Medication Sig Dispense Refill   aspirin EC 81 MG tablet Take 81 mg by mouth daily. (Patient not taking: Reported on 11/09/2019)     Cholecalciferol (VITAMIN D3 PO) Take 1 tablet by mouth daily.  (Patient not taking: Reported on 11/09/2019)     Cyanocobalamin (VITAMIN B12 PO) Take 2 tablets by mouth daily. (Patient not taking: Reported on  11/09/2019)     ferrous sulfate 325 (65 FE) MG tablet Take 1 tablet (325 mg total) by mouth daily. 30 tablet 3   glipiZIDE (GLUCOTROL) 10 MG tablet Take 10 mg by mouth 2 (two) times daily.     lisinopril (ZESTRIL) 20 MG tablet Take 20 mg by mouth daily.     metFORMIN (GLUCOPHAGE) 1000 MG tablet Take 2,000 mg by mouth 2 (two) times daily.      Na Sulfate-K Sulfate-Mg Sulf 17.5-3.13-1.6 GM/177ML SOLN Suprep (no substitutions)-TAKE AS DIRECTED. Lot [id number], exp 10/22 354 mL 0   omeprazole (PRILOSEC) 20 MG capsule TAKE 1 CAPSULE BY MOUTH ONCE DAILY 30 capsule 0   sertraline (ZOLOFT) 50 MG tablet Take 50 mg by mouth daily.     simvastatin (ZOCOR) 20 MG tablet Take 20 mg by mouth every evening.     Current Facility-Administered Medications on File Prior to Visit  Medication Dose Route Frequency Provider Last Rate Last Admin   0.9 %  sodium chloride infusion  500 mL Intravenous Once Irene Shipper, MD        ALLERGIES: Allergies  Allergen Reactions   Penicillins Swelling and Rash    Did it involve swelling of the face/tongue/throat, SOB, or low BP? Yes Did it involve sudden or severe rash/hives, skin peeling, or any reaction on the inside of your mouth or nose? Yes Did you need to seek medical attention at a  hospital or doctor's office? Yes When did it last happen? Childhood If all above answers are NO, may proceed with cephalosporin use.     FAMILY HISTORY: Family History  Problem Relation Age of Onset   Goiter Mother    Skin cancer Brother    Colon cancer Neg Hx    Esophageal cancer Neg Hx    Rectal cancer Neg Hx    Stomach cancer Neg Hx    Colon polyps Neg Hx       Objective:  *** General: No acute distress.  Patient appears ***-groomed.   Head:  Normocephalic/atraumatic Eyes:  Fundi examined but not visualized Neck: supple, no paraspinal tenderness, full range of motion Heart:  Regular rate and rhythm Lungs:  Clear to auscultation bilaterally Back: No paraspinal  tenderness Neurological Exam: alert and oriented to person, place, and time.  Speech fluent and not dysarthric, language intact.  CN II-XII intact. Bulk and tone normal, muscle strength 5/5 throughout.  Sensation to light touch intact.  Deep tendon reflexes 2+ throughout, toes downgoing.  Finger to nose testing intact.  Gait normal, Romberg negative.   Metta Clines, DO  CC: ***

## 2021-03-07 ENCOUNTER — Other Ambulatory Visit: Payer: Self-pay

## 2021-03-07 ENCOUNTER — Ambulatory Visit
Admission: RE | Admit: 2021-03-07 | Discharge: 2021-03-07 | Disposition: A | Discharge status: Home / Self Care | Payer: Medicare Other | Source: Ambulatory Visit | Attending: Family Medicine | Admitting: Family Medicine

## 2021-03-07 DIAGNOSIS — E278 Other specified disorders of adrenal gland: Secondary | ICD-10-CM

## 2021-03-08 ENCOUNTER — Ambulatory Visit: Payer: Medicare Other | Admitting: Neurology

## 2021-05-10 ENCOUNTER — Encounter: Payer: Self-pay | Admitting: Physician Assistant

## 2021-05-10 ENCOUNTER — Ambulatory Visit (INDEPENDENT_AMBULATORY_CARE_PROVIDER_SITE_OTHER): Payer: Medicare Other | Admitting: Physician Assistant

## 2021-05-10 DIAGNOSIS — L281 Prurigo nodularis: Secondary | ICD-10-CM

## 2021-05-10 DIAGNOSIS — L570 Actinic keratosis: Secondary | ICD-10-CM

## 2021-05-10 MED ORDER — DUPIXENT 300 MG/2ML ~~LOC~~ SOAJ: 600.0000 mg | Freq: Once | SUBCUTANEOUS | 0 refills | Status: AC

## 2021-05-10 NOTE — Patient Instructions (Addendum)
Dupixent samples given. You will take 300 mg (1 shot) every two weeks. Take out of fridge 30-40 min before giving shot.  ? ?Next Shot - May 3rd ?Next shot- May 17th  ?Next shot- May 31st  ? ?

## 2021-05-11 ENCOUNTER — Encounter: Payer: Self-pay | Admitting: Physician Assistant

## 2021-05-11 ENCOUNTER — Telehealth: Payer: Self-pay

## 2021-05-11 NOTE — Progress Notes (Signed)
? ?  New Patient ?  ?Subjective  ?Laura Winters is a 81 y.o. female who presents for the following: New Patient (Initial Visit) (Patient here today for itching and redness all over x years per patient she use to live in [place]. Per patient no some bleeding with scratching, no household changes, no medication change, no pets in the home, no one else in the home. Per patient her PCP gave her TAC that didn't help, patient states that she's used multiple OTC lotions and creams. ). ? ? ?The following portions of the chart were reviewed this encounter and updated as appropriate:  Tobacco  Allergies  Meds  Problems  Med Hx  Surg Hx  Fam Hx   ?  ? ?Objective  ?Well appearing patient in no apparent distress; mood and affect are within normal limits. ? ?A full examination was performed including scalp, head, eyes, ears, nose, lips, neck, chest, axillae, abdomen, back, buttocks, bilateral upper extremities, bilateral lower extremities, hands, feet, fingers, toes, fingernails, and toenails. All findings within normal limits unless otherwise noted below. ? ?Left Lower Leg - Anterior, Mid Back, Right Lower Leg - Anterior ?Excoriated nodules and ulcerations ? ?Right Upper Cutaneous Lip (3) ?Erythematous patches with gritty scale. ? ? ?Assessment & Plan  ?Prurigo nodularis ?Left Lower Leg - Anterior; Right Lower Leg - Anterior; Mid Back ? ?Start Dupixent today. Initial loading dose given today. Samples sent with patient until we can get approval.  ? ?Dupilumab (DUPIXENT) 300 MG/2ML SOPN - Left Lower Leg - Anterior, Mid Back, Right Lower Leg - Anterior ?Inject 600 mg into the skin once for 1 dose. On day 1. ? ?AK (actinic keratosis) (3) ?Right Upper Cutaneous Lip ? ?Destruction of lesion - Right Upper Cutaneous Lip ?Complexity: simple   ?Destruction method: cryotherapy   ?Informed consent: discussed and consent obtained   ?Timeout:  patient name, date of birth, surgical site, and procedure verified ?Lesion destroyed using  liquid nitrogen: Yes   ?Cryotherapy cycles:  1 ?Outcome: patient tolerated procedure well with no complications   ?Post-procedure details: wound care instructions given   ? ? ? ? ?I, Takari Lundahl, PA-C, have reviewed all documentation's for this visit.  The documentation on 05/11/21 for the exam, diagnosis, procedures and orders are all accurate and complete. ?

## 2021-05-11 NOTE — Telephone Encounter (Signed)
New prescription and enrollment form faxed in to [facility].  ?

## 2021-05-15 ENCOUNTER — Telehealth: Payer: Self-pay

## 2021-05-15 NOTE — Telephone Encounter (Signed)
Enrollment and new prescription sent to [facility] through the portal.  ?

## 2021-05-17 NOTE — Telephone Encounter (Signed)
Fax received from [facility] and [employer] stating that the patient's Dupixent is approved from 01/22/21 through 11/12/21. ?

## 2021-06-12 ENCOUNTER — Ambulatory Visit (INDEPENDENT_AMBULATORY_CARE_PROVIDER_SITE_OTHER): Payer: Medicare Other | Admitting: Physician Assistant

## 2021-06-12 ENCOUNTER — Encounter: Payer: Self-pay | Admitting: Physician Assistant

## 2021-06-12 DIAGNOSIS — L281 Prurigo nodularis: Secondary | ICD-10-CM | POA: Diagnosis not present

## 2021-06-12 MED ORDER — DESOXIMETASONE 0.25 % EX CREA: TOPICAL_CREAM | CUTANEOUS | 5 refills | Status: AC

## 2021-06-12 MED ORDER — DESOXIMETASONE 0.25 % EX CREA: 1.0000 "application " | TOPICAL_CREAM | Freq: Two times a day (BID) | CUTANEOUS | 5 refills | Status: DC

## 2021-06-12 NOTE — Patient Instructions (Signed)
   [facility]   [phone]

## 2021-06-12 NOTE — Progress Notes (Signed)
   Follow-Up Visit   Subjective  Laura Winters is a 81 y.o. female who presents for the following: Prurigo Nodularis (Patient here today for [place] follow up for prurigo nodularis, per patient she's still itching and she doesn't think the [place] is working. She has taken 4 shots. She is tolerating the medication. No adverse effects. Per patient she believes it's bugs that's causing her to itch. ).   The following portions of the chart were reviewed this encounter and updated as appropriate:  Tobacco  Allergies  Meds  Problems  Med Hx  Surg Hx  Fam Hx      Objective  Well appearing patient in no apparent distress; mood and affect are within normal limits.  All skin waist up examined.  Excoriations of hyperkeratotic plaque   Assessment & Plan  Prurigo nodularis  Continue dupixent- she has taken 4 shots.  desoximetasone (TOPICORT) 0.25 % cream Apply to affected area qd    I, Mozetta Murfin, PA-C, have reviewed all documentation's for this visit.  The documentation on 06/12/21 for the exam, diagnosis, procedures and orders are all accurate and complete.

## 2021-06-15 ENCOUNTER — Ambulatory Visit: Payer: Medicare Other | Admitting: Physician Assistant

## 2021-06-20 ENCOUNTER — Telehealth: Payer: Self-pay

## 2021-06-20 NOTE — Telephone Encounter (Signed)
Fax received from [facility] stating that the patient has scheduled delivery for 06/16/2021 and her copay is $10.35.

## 2021-08-08 ENCOUNTER — Ambulatory Visit: Payer: Medicare Other | Admitting: Physician Assistant

## 2021-08-24 ENCOUNTER — Other Ambulatory Visit: Payer: Self-pay

## 2021-08-24 ENCOUNTER — Emergency Department (HOSPITAL_COMMUNITY)
Admission: EM | Admit: 2021-08-24 | Discharge: 2021-08-24 | Disposition: A | Discharge status: Home / Self Care | Payer: Medicare Other | Attending: Emergency Medicine | Admitting: Emergency Medicine

## 2021-08-24 DIAGNOSIS — Z79899 Other long term (current) drug therapy: Secondary | ICD-10-CM | POA: Insufficient documentation

## 2021-08-24 DIAGNOSIS — Z7984 Long term (current) use of oral hypoglycemic drugs: Secondary | ICD-10-CM | POA: Insufficient documentation

## 2021-08-24 DIAGNOSIS — E1165 Type 2 diabetes mellitus with hyperglycemia: Secondary | ICD-10-CM | POA: Diagnosis not present

## 2021-08-24 DIAGNOSIS — Z794 Long term (current) use of insulin: Secondary | ICD-10-CM | POA: Insufficient documentation

## 2021-08-24 DIAGNOSIS — R739 Hyperglycemia, unspecified: Secondary | ICD-10-CM

## 2021-08-24 LAB — CBC
HCT: 33.9 % — ABNORMAL LOW (ref 36.0–46.0)
Hemoglobin: 11.5 g/dL — ABNORMAL LOW (ref 12.0–15.0)
MCH: 30.7 pg (ref 26.0–34.0)
MCHC: 33.9 g/dL (ref 30.0–36.0)
MCV: 90.6 fL (ref 80.0–100.0)
Platelets: 172 10*3/uL (ref 150–400)
RBC: 3.74 MIL/uL — ABNORMAL LOW (ref 3.87–5.11)
RDW: 12.9 % (ref 11.5–15.5)
WBC: 7.4 10*3/uL (ref 4.0–10.5)
nRBC: 0 % (ref 0.0–0.2)

## 2021-08-24 LAB — URINALYSIS, ROUTINE W REFLEX MICROSCOPIC
Bacteria, UA: NONE SEEN
Bilirubin Urine: NEGATIVE
Glucose, UA: >=500 mg/dL — AB
Hgb urine dipstick: NEGATIVE
Ketones, ur: NEGATIVE mg/dL
Leukocytes,Ua: NEGATIVE
Nitrite: NEGATIVE
Protein, ur: 100 mg/dL — AB
Specific Gravity, Urine: 1.016 (ref 1.005–1.030)
pH: 5 (ref 5.0–8.0)

## 2021-08-24 LAB — BASIC METABOLIC PANEL
Anion gap: 10 (ref 5–15)
BUN: 22 mg/dL (ref 8–23)
CO2: 28 mmol/L (ref 22–32)
Calcium: 9.5 mg/dL (ref 8.9–10.3)
Chloride: 94 mmol/L — ABNORMAL LOW (ref 98–111)
Creatinine, Ser: 1.4 mg/dL — ABNORMAL HIGH (ref 0.44–1.00)
GFR, Estimated: 38 mL/min — ABNORMAL LOW (ref >60)
Glucose, Bld: 502 mg/dL (ref 70–99)
Potassium: 4.4 mmol/L (ref 3.5–5.1)
Sodium: 132 mmol/L — ABNORMAL LOW (ref 135–145)

## 2021-08-24 LAB — CBG MONITORING, ED
Glucose-Capillary: 549 mg/dL (ref 70–99)
Glucose-Capillary: 411 mg/dL — ABNORMAL HIGH (ref 70–99)
Glucose-Capillary: 321 mg/dL — ABNORMAL HIGH (ref 70–99)

## 2021-08-24 MED ORDER — SODIUM CHLORIDE 0.9 % IV BOLUS
1000.0000 mL | Freq: Once | INTRAVENOUS | Status: AC
  Administered 2021-08-24: 1000 mL via INTRAVENOUS

## 2021-08-24 MED ORDER — INSULIN ASPART 100 UNIT/ML IJ SOLN
10.0000 [IU] | Freq: Once | INTRAMUSCULAR | Status: AC
  Administered 2021-08-24: 10 [IU] via SUBCUTANEOUS

## 2021-08-24 NOTE — ED Notes (Signed)
Patient denies pain and is resting comfortably.   Has no symptomatic complaints.

## 2021-08-24 NOTE — ED Notes (Signed)
Adult triage RN made aware of patient's CBG result

## 2021-08-24 NOTE — Discharge Instructions (Signed)
Your blood sugar was elevated.  I recommend that you increase Lantus to 25 units every night.  Your blood sugar likely will still be elevated around 200-300.  You need to keep a detailed log of your blood sugar and see your doctor next week for follow-up  Return to ER if you have severe abdominal pain, vomiting, fever, glucose > 600 or less than 60

## 2021-08-24 NOTE — ED Provider Notes (Signed)
[facility] EMERGENCY DEPARTMENT Provider Note   CSN: [id number] Arrival date & time: 08/24/21  1525     History  Chief Complaint  Patient presents with   Hyperglycemia    Laura Winters is a 81 y.o. female here with hyperglycemia.  Patient has history of diabetes.  Patient also has pruirigo and recently started on dupixent about a month ago.  Patient states that she saw another primary care doctor in the same practice about 2 weeks ago.  She was taken off of metformin and continue on Lantus.  She states that she has been compliant with her 15 units of Lantus every night.  She states that for the last 3 to 4 days, her blood sugar has been persistently elevated around 400-500.  Patient denies any abdominal pain or vomiting or fevers.  Patient was sent in by primary care doctor for evaluation.  The history is provided by the patient.       Home Medications Prior to Admission medications   Medication Sig Start Date End Date Taking? Authorizing Provider  amLODipine (NORVASC) 2.5 MG tablet SMARTSIG:1 Tablet(s) By Mouth Every Evening 03/27/21   [provider]  amLODipine (NORVASC) 5 MG tablet SMARTSIG:1 Tablet(s) By Mouth Every Evening 04/18/21   [provider]  Cholecalciferol (VITAMIN D3 PO) Take 1 tablet by mouth daily.    [provider]  Cyanocobalamin (VITAMIN B12 PO) Take 2 tablets by mouth daily.    [provider]  desoximetasone (TOPICORT) 0.25 % cream Apply to affected area qd 06/12/21   Robyne Askew R, PA-C  donepezil (ARICEPT) 5 MG tablet SMARTSIG:1 Tablet(s) By Mouth Every Evening 03/25/21   [provider]  DUPIXENT 300 MG/2ML SOPN Inject into the skin. 05/31/21   [provider]  ferrous sulfate 325 (65 FE) MG tablet Take 1 tablet (325 mg total) by mouth daily. 01/31/19 01/31/20  Al Decant, MD  glipiZIDE (GLUCOTROL) 10 MG tablet Take 10 mg by mouth 2 (two) times daily. 01/26/19   [provider]   Insulin Glargine (BASAGLAR KWIKPEN) 100 UNIT/ML SMARTSIG:15 Unit(s) SUB-Q Every Night 02/10/21   [provider]  lisinopril (ZESTRIL) 20 MG tablet Take 20 mg by mouth daily. 01/27/19   [provider]  metFORMIN (GLUCOPHAGE) 1000 MG tablet Take 2,000 mg by mouth 2 (two) times daily.  01/26/19   [provider]  Na Sulfate-K Sulfate-Mg Sulf 17.5-3.13-1.6 GM/177ML SOLN Suprep (no substitutions)-TAKE AS DIRECTED. Lot [id number], exp 10/22 02/16/19   Irene Shipper, MD  omeprazole (PRILOSEC) 20 MG capsule TAKE 1 CAPSULE BY MOUTH ONCE DAILY 08/18/20   Irene Shipper, MD  sertraline (ZOLOFT) 100 MG tablet Take 100 mg by mouth daily. 02/05/21   [provider]  simvastatin (ZOCOR) 40 MG tablet Take 40 mg by mouth at bedtime. 05/07/21   [provider]  triamcinolone cream (KENALOG) 0.1 % APPLY SPARINGLY TO AFFECTED AREA TWICE A DAY 02/09/21   [provider]      Allergies    Penicillins    Review of Systems   Review of Systems  All other systems reviewed and are negative.   Physical Exam Updated Vital Signs BP (!) 162/67   Pulse 64   Temp 98.5 F (36.9 C)   Resp 18   SpO2 100%  Physical Exam Vitals and nursing note reviewed.  Constitutional:      Appearance: Normal appearance.  HENT:     Head: Normocephalic.     Right Ear: Tympanic  membrane normal.     Nose: Nose normal.     Mouth/Throat:     Mouth: Mucous membranes are moist.  Eyes:     Extraocular Movements: Extraocular movements intact.     Pupils: Pupils are equal, round, and reactive to light.  Cardiovascular:     Rate and Rhythm: Normal rate and regular rhythm.     Pulses: Normal pulses.     Heart sounds: Normal heart sounds.  Pulmonary:     Effort: Pulmonary effort is normal.     Breath sounds: Normal breath sounds.  Abdominal:     General: Abdomen is flat.     Palpations: Abdomen is soft.  Musculoskeletal:        General: Normal range of motion.     Cervical back: Normal  range of motion and neck supple.  Skin:    General: Skin is warm.     Capillary Refill: Capillary refill takes less than 2 seconds.  Neurological:     General: No focal deficit present.     Mental Status: She is alert and oriented to person, place, and time.  Psychiatric:        Mood and Affect: Mood normal.        Behavior: Behavior normal.     ED Results / Procedures / Treatments   Labs (all labs ordered are listed, but only abnormal results are displayed) Labs Reviewed  BASIC METABOLIC PANEL - Abnormal; Notable for the following components:      Result Value   Sodium 132 (*)    Chloride 94 (*)    Glucose, Bld 502 (*)    Creatinine, Ser 1.40 (*)    GFR, Estimated 38 (*)    All other components within normal limits  CBC - Abnormal; Notable for the following components:   RBC 3.74 (*)    Hemoglobin 11.5 (*)    HCT 33.9 (*)    All other components within normal limits  URINALYSIS, ROUTINE W REFLEX MICROSCOPIC - Abnormal; Notable for the following components:   Color, Urine STRAW (*)    Glucose, UA >=500 (*)    Protein, ur 100 (*)    All other components within normal limits  CBG MONITORING, ED - Abnormal; Notable for the following components:   Glucose-Capillary 549 (*)    All other components within normal limits  CBG MONITORING, ED - Abnormal; Notable for the following components:   Glucose-Capillary 411 (*)    All other components within normal limits  CBG MONITORING, ED    EKG None  Radiology No results found.  Procedures Procedures    Medications Ordered in ED Medications  sodium chloride 0.9 % bolus 1,000 mL (has no administration in time range)  insulin aspart (novoLOG) injection 10 Units (has no administration in time range)    ED Course/ Medical Decision Making/ A&P                           Medical Decision Making Laura Winters is a 81 y.o. female here presenting with hyperglycemia.  Patient recently taken off of metformin.  Patient is still  Lantus 15 units.  Plan to get CBC and CMP and UA and will hydrate patient and give subcu insulin.  Low suspicion for DKA.  I think patient likely needs to have her insulin dose increased  10:58 PM Labs showed glucose of 500.  Anion gap is normal.  Patient was given a liter normal saline and  10 units of insulin and glucose went down to 321.  Patient has Lantus pen and I told her to increase her Lantus to 25 units nightly from 15 units.  I told her that her blood sugar will likely be elevated between 200-300 and she will need to follow-up with PCP in a week and will likely need further increase of her Lantus.  Stable for discharge for now.  Problems Addressed: Hyperglycemia: acute illness or injury  Amount and/or Complexity of Data Reviewed Labs: ordered. Decision-making details documented in ED Course. Radiology: ordered and independent interpretation performed. Decision-making details documented in ED Course.  Risk Prescription drug management.   Final Clinical Impression(s) / ED Diagnoses Final diagnoses:  None    Rx / DC Orders ED Discharge Orders     None         Drenda Freeze, MD 08/24/21 2302

## 2021-08-24 NOTE — ED Notes (Signed)
ED Provider at bedside. 

## 2021-08-24 NOTE — Progress Notes (Signed)
Patient called our office ([facility]) today and we had a phone visit.  Laura Winters is a very nice 81 y/o F with uncontrolled T2DM on insulin only, dementia, CKD3, HTN, h/o uterine CA, hyperlipidemia, and other chronic medical issues.  BS has been high for about 4 days- in the 300s-400s. Prior to that was running 100-125 BS this morning was 396. Took her usual lantus dose 15 units at 2 am When she woke up around 11 am her BS was 396. She took 10 units of her lantus at that time per our instructions. Blood sugar now on the phone with me is 496. She has been really cold but no fevers/chills. No infection symptoms. No N/V/D. No chest pain, no SOB. She is very weak. She slept late today, just ate some breakfast- she had not eaten since yesterday. Just now she ate three small pancakes with blueberries and sugar free syrup.  Most recent HEMOGLOBIN A1C mid-July was 9.1. Prior to that her HEMOGLOBIN A1C was 11.9 in Jan 2023. She has CKD, SCr 1.67 (GFR 31). Stopped metformin last week b/c kidney function had worsened.  I am very concerned about her memory/cognition and feel that she may be forgetting to take her insulin.  Also could be infection, coronary ischemia causing her hyperglycemia. Needs urgent evaluation, IV fluids, glucose management, eval for etiology of her hyperglycemia.  I got her son Laura Winters on the phone and he is going over to take her to the ER. We need prior PCP records re: her DM. She is only on insulin (recently stopped metformin due to CKD and GFR down to 31.) I do not know if she's ever been on any other DM meds.  Royal Piedra, MD NPI: [id number] [place] with [facility] [address] [place], [place] [address] Phone: [phone] Fax: [phone]

## 2021-08-24 NOTE — ED Provider Triage Note (Signed)
Emergency Medicine Provider Triage Evaluation Note  Laura Winters , a 81 y.o. female  was evaluated in triage.  Pt complains of hyperglycemia.  Patient has been having worsening high blood sugars over the past 3 to 4 days.  Initially started in the 300s now over 500 today.  She reports that she has been taking her insulin at night and has not missed any doses.  She reports she used to be on additional diabetes medications but was taken off of those.  Her PCP also noted on blood work that she had had done earlier this week that her kidney function was worsened.  She denies any fevers or chills, no chest pain, shortness of breath, abdominal pain, nausea, vomiting or diarrhea.  Denies any significant dietary changes or  Review of Systems  Positive: High blood sugar, Negative: `Fever, chest pain, shortness of breath, abdominal pain, nausea, vomiting  Physical Exam  BP (!) 156/68 (BP Location: Right Arm)   Pulse 80   Temp 98.3 F (36.8 C)   Resp 16   SpO2 99%  Gen:   Awake, no distress   Resp:  Normal effort  MSK:   Moves extremities without difficulty  Other:    Medical Decision Making  Medically screening exam initiated at 4:33 PM.  Appropriate orders placed.  Laura Winters was informed that the remainder of the evaluation will be completed by another provider, this initial triage assessment does not replace that evaluation, and the importance of remaining in the ED until their evaluation is complete.     Jacqlyn Larsen, [place] 08/24/21 [phone]

## 2021-08-24 NOTE — ED Triage Notes (Signed)
Pt reports increasing blood sugars x 3 days.  Between 350 and 500.  Takes long acting insulin at night only, no meal coverage.  Pt states her PCP told her to take an additional 10 units of that about 1pm today but pt's blood sugar continues to rise.  Pt has what she describes as dull headache.  Not sharp. No n/v/d or shob.

## 2021-09-05 NOTE — Telephone Encounter (Signed)
Per senderra they spoke with patient and she is not taking the [place] and we can d/c any follow up for the medication. [phone]

## 2021-09-21 ENCOUNTER — Ambulatory Visit: Payer: Medicare Other | Admitting: Physician Assistant

## 2021-10-02 ENCOUNTER — Emergency Department (HOSPITAL_COMMUNITY)
Admission: EM | Admit: 2021-10-02 | Discharge: 2021-10-03 | Disposition: A | Discharge status: Home / Self Care | Payer: Medicare Other | Attending: Emergency Medicine | Admitting: Emergency Medicine

## 2021-10-02 ENCOUNTER — Other Ambulatory Visit: Payer: Self-pay

## 2021-10-02 ENCOUNTER — Encounter (HOSPITAL_COMMUNITY): Payer: Self-pay

## 2021-10-02 DIAGNOSIS — R739 Hyperglycemia, unspecified: Secondary | ICD-10-CM | POA: Insufficient documentation

## 2021-10-02 LAB — URINALYSIS, ROUTINE W REFLEX MICROSCOPIC
Bacteria, UA: NONE SEEN
Bilirubin Urine: NEGATIVE
Glucose, UA: >=500 mg/dL — AB
Hgb urine dipstick: NEGATIVE
Ketones, ur: NEGATIVE mg/dL
Leukocytes,Ua: NEGATIVE
Nitrite: NEGATIVE
Protein, ur: 30 mg/dL — AB
Specific Gravity, Urine: 1.012 (ref 1.005–1.030)
pH: 5 (ref 5.0–8.0)

## 2021-10-02 LAB — COMPREHENSIVE METABOLIC PANEL
ALT: 15 U/L (ref 0–44)
AST: 17 U/L (ref 15–41)
Albumin: 4.1 g/dL (ref 3.5–5.0)
Alkaline Phosphatase: 106 U/L (ref 38–126)
Anion gap: 7 (ref 5–15)
BUN: 30 mg/dL — ABNORMAL HIGH (ref 8–23)
CO2: 23 mmol/L (ref 22–32)
Calcium: 9.6 mg/dL (ref 8.9–10.3)
Chloride: 105 mmol/L (ref 98–111)
Creatinine, Ser: 1.49 mg/dL — ABNORMAL HIGH (ref 0.44–1.00)
GFR, Estimated: 35 mL/min — ABNORMAL LOW (ref >60)
Glucose, Bld: 426 mg/dL — ABNORMAL HIGH (ref 70–99)
Potassium: 5 mmol/L (ref 3.5–5.1)
Sodium: 135 mmol/L (ref 135–145)
Total Bilirubin: 0.4 mg/dL (ref 0.3–1.2)
Total Protein: 7.3 g/dL (ref 6.5–8.1)

## 2021-10-02 LAB — CBC
HCT: 34.9 % — ABNORMAL LOW (ref 36.0–46.0)
Hemoglobin: 11.7 g/dL — ABNORMAL LOW (ref 12.0–15.0)
MCH: 31.6 pg (ref 26.0–34.0)
MCHC: 33.5 g/dL (ref 30.0–36.0)
MCV: 94.3 fL (ref 80.0–100.0)
Platelets: 181 10*3/uL (ref 150–400)
RBC: 3.7 MIL/uL — ABNORMAL LOW (ref 3.87–5.11)
RDW: 13.1 % (ref 11.5–15.5)
WBC: 8.4 10*3/uL (ref 4.0–10.5)
nRBC: 0 % (ref 0.0–0.2)

## 2021-10-02 LAB — CBG MONITORING, ED: Glucose-Capillary: 459 mg/dL — ABNORMAL HIGH (ref 70–99)

## 2021-10-02 LAB — BETA-HYDROXYBUTYRIC ACID: Beta-Hydroxybutyric Acid: 0.1 mmol/L (ref 0.05–0.27)

## 2021-10-02 NOTE — ED Triage Notes (Signed)
Per EMS-patient is from home. Patient reports that her CBG at home was 570.  CBG with EMS- 550. Patient c/o thirst and diarrhea.

## 2021-10-02 NOTE — ED Provider Triage Note (Signed)
Emergency Medicine Provider Triage Evaluation Note  Laura Winters , a 81 y.o. female  was evaluated in triage.  Pt complains of hyperglycemia.  Reports for the past few days she has felt wobbly and uneasy on her feet.  Checked her blood sugar today and it was in the 590s.  Has been taking her insulin as prescribed.  Review of Systems  Positive: Unsteady Negative: Abdominal pain, nausea, vomiting  Physical Exam  BP (!) 139/97 (BP Location: Right Arm)   Pulse 67   Temp 98.6 F (37 C) (Oral)   Resp 18   SpO2 98%  Gen:   Awake, no distress   Resp:  Normal effort  MSK:   Moves extremities without difficulty \ Other:  Well-appearing on physical exam.  Not tachycardic or diaphoretic.  Medical Decision Making  Medically screening exam initiated at 5:03 PM.  Appropriate orders placed.  Laura Winters was informed that the remainder of the evaluation will be completed by another provider, this initial triage assessment does not replace that evaluation, and the importance of remaining in the ED until their evaluation is complete.     Rhae Hammock, PA-C 10/02/21 1705

## 2021-10-03 LAB — CBG MONITORING, ED
Glucose-Capillary: 265 mg/dL — ABNORMAL HIGH (ref 70–99)
Glucose-Capillary: 310 mg/dL — ABNORMAL HIGH (ref 70–99)

## 2021-10-03 MED ORDER — SODIUM CHLORIDE 0.9 % IV BOLUS
500.0000 mL | Freq: Once | INTRAVENOUS | Status: AC
  Administered 2021-10-03: 500 mL via INTRAVENOUS

## 2021-10-03 MED ORDER — INSULIN ASPART 100 UNIT/ML IJ SOLN
12.0000 [IU] | Freq: Once | INTRAMUSCULAR | Status: AC
  Administered 2021-10-03: 12 [IU] via SUBCUTANEOUS
  Filled 2021-10-03: qty 0.12

## 2021-10-03 NOTE — ED Provider Notes (Signed)
[facility] DEPT Provider Note   CSN: [id number] Arrival date & time: 10/02/21  1642     History  Chief Complaint  Patient presents with   Hyperglycemia    Laura Winters is a 81 y.o. female.  Patient presents to the emergency department for evaluation of elevated blood sugar.  Patient reports that she has been having high blood sugars for the last few weeks.  Several weeks ago blood sugar went close to 600 and her doctor adjusted her insulin.  The last few days she has been feeling off balance, having increased thirst.  She checked her blood sugar today and it was 590.  She has been taking the increased dose of her insulin as instructed.  No other changes have been made.  She has not had any illness, denies fever, chills, vomiting, diarrhea, URI symptoms.       Home Medications Prior to Admission medications   Medication Sig Start Date End Date Taking? Authorizing Provider  amLODipine (NORVASC) 2.5 MG tablet SMARTSIG:1 Tablet(s) By Mouth Every Evening 03/27/21   [provider]  amLODipine (NORVASC) 5 MG tablet SMARTSIG:1 Tablet(s) By Mouth Every Evening 04/18/21   [provider]  Cholecalciferol (VITAMIN D3 PO) Take 1 tablet by mouth daily.    [provider]  Cyanocobalamin (VITAMIN B12 PO) Take 2 tablets by mouth daily.    [provider]  desoximetasone (TOPICORT) 0.25 % cream Apply to affected area qd 06/12/21   Robyne Askew R, PA-C  donepezil (ARICEPT) 5 MG tablet SMARTSIG:1 Tablet(s) By Mouth Every Evening 03/25/21   [provider]  DUPIXENT 300 MG/2ML SOPN Inject into the skin. 05/31/21   [provider]  ferrous sulfate 325 (65 FE) MG tablet Take 1 tablet (325 mg total) by mouth daily. 01/31/19 01/31/20  Al Decant, MD  glipiZIDE (GLUCOTROL) 10 MG tablet Take 10 mg by mouth 2 (two) times daily. 01/26/19   [provider]  Insulin Glargine (BASAGLAR KWIKPEN) 100 UNIT/ML SMARTSIG:15  Unit(s) SUB-Q Every Night 02/10/21   [provider]  lisinopril (ZESTRIL) 20 MG tablet Take 20 mg by mouth daily. 01/27/19   [provider]  metFORMIN (GLUCOPHAGE) 1000 MG tablet Take 2,000 mg by mouth 2 (two) times daily.  01/26/19   [provider]  Na Sulfate-K Sulfate-Mg Sulf 17.5-3.13-1.6 GM/177ML SOLN Suprep (no substitutions)-TAKE AS DIRECTED. Lot [id number], exp 10/22 02/16/19   Irene Shipper, MD  omeprazole (PRILOSEC) 20 MG capsule TAKE 1 CAPSULE BY MOUTH ONCE DAILY 08/18/20   Irene Shipper, MD  sertraline (ZOLOFT) 100 MG tablet Take 100 mg by mouth daily. 02/05/21   [provider]  simvastatin (ZOCOR) 40 MG tablet Take 40 mg by mouth at bedtime. 05/07/21   [provider]  triamcinolone cream (KENALOG) 0.1 % APPLY SPARINGLY TO AFFECTED AREA TWICE A DAY 02/09/21   [provider]      Allergies    Penicillins    Review of Systems   Review of Systems  Physical Exam Updated Vital Signs BP (!) 164/65   Pulse 70   Temp 97.6 F (36.4 C) (Oral)   Resp 18   Ht 5' (1.524 m)   Wt 59.9 kg   SpO2 97%   BMI 25.78 kg/m  Physical Exam Vitals and nursing note reviewed.  Constitutional:      General: She is not in acute distress.    Appearance: She is well-developed.  HENT:     Head: Normocephalic and atraumatic.  Mouth/Throat:     Mouth: Mucous membranes are moist.  Eyes:     General: Vision grossly intact. Gaze aligned appropriately.     Extraocular Movements: Extraocular movements intact.     Conjunctiva/sclera: Conjunctivae normal.  Cardiovascular:     Rate and Rhythm: Normal rate and regular rhythm.     Pulses: Normal pulses.     Heart sounds: Normal heart sounds, S1 normal and S2 normal. No murmur heard.    No friction rub. No gallop.  Pulmonary:     Effort: Pulmonary effort is normal. No respiratory distress.     Breath sounds: Normal breath sounds.  Abdominal:     General: Bowel sounds are normal.     Palpations:  Abdomen is soft.     Tenderness: There is no abdominal tenderness. There is no guarding or rebound.     Hernia: No hernia is present.  Musculoskeletal:        General: No swelling.     Cervical back: Full passive range of motion without pain, normal range of motion and neck supple. No spinous process tenderness or muscular tenderness. Normal range of motion.     Right lower leg: No edema.     Left lower leg: No edema.  Skin:    General: Skin is warm and dry.     Capillary Refill: Capillary refill takes less than 2 seconds.     Findings: No ecchymosis, erythema, rash or wound.  Neurological:     General: No focal deficit present.     Mental Status: She is alert and oriented to person, place, and time.     GCS: GCS eye subscore is 4. GCS verbal subscore is 5. GCS motor subscore is 6.     Cranial Nerves: Cranial nerves 2-12 are intact.     Sensory: Sensation is intact.     Motor: Motor function is intact.     Coordination: Coordination is intact.  Psychiatric:        Attention and Perception: Attention normal.        Mood and Affect: Mood normal.        Speech: Speech normal.        Behavior: Behavior normal.     ED Results / Procedures / Treatments   Labs (all labs ordered are listed, but only abnormal results are displayed) Labs Reviewed  CBC - Abnormal; Notable for the following components:      Result Value   RBC 3.70 (*)    Hemoglobin 11.7 (*)    HCT 34.9 (*)    All other components within normal limits  URINALYSIS, ROUTINE W REFLEX MICROSCOPIC - Abnormal; Notable for the following components:   Color, Urine STRAW (*)    Glucose, UA >=500 (*)    Protein, ur 30 (*)    All other components within normal limits  COMPREHENSIVE METABOLIC PANEL - Abnormal; Notable for the following components:   Glucose, Bld 426 (*)    BUN 30 (*)    Creatinine, Ser 1.49 (*)    GFR, Estimated 35 (*)    All other components within normal limits  CBG MONITORING, ED - Abnormal; Notable for  the following components:   Glucose-Capillary 459 (*)    All other components within normal limits  CBG MONITORING, ED - Abnormal; Notable for the following components:   Glucose-Capillary 310 (*)    All other components within normal limits  CBG MONITORING, ED - Abnormal; Notable for the following components:   Glucose-Capillary 265 (*)  All other components within normal limits  BETA-HYDROXYBUTYRIC ACID    EKG None  Radiology No results found.  Procedures Procedures    Medications Ordered in ED Medications  sodium chloride 0.9 % bolus 500 mL (500 mLs Intravenous New Bag/Given 10/03/21 0147)  insulin aspart (novoLOG) injection 12 Units (12 Units Subcutaneous Given 10/03/21 0148)    ED Course/ Medical Decision Making/ A&P                           Medical Decision Making Risk Prescription drug management.   Patient presents to the emergency department for evaluation of elevated blood sugar.  Patient reports that she was previously on metformin with her insulin, was told to stop by her doctor, she is not sure why.  Records indicate she also has been on glipizide in the past.  Patient has had labile blood sugars over the last few weeks.  Insulin was adjusted by her PCP after the first episode of elevated blood sugar 3 weeks ago.  Blood sugars once again elevated.  Etiology unclear.  She appears well.  No signs of infection.  Work-up shows no evidence of DKA.  Doubt hyperosmolar state.  Administered IV fluids and insulin.  Blood glucose is improved.  Does not require hospitalization at this time, needs prompt PCP follow-up for medication adjustment.        Final Clinical Impression(s) / ED Diagnoses Final diagnoses:  Hyperglycemia    Rx / DC Orders ED Discharge Orders     None         Adelaide Pfefferkorn, Gwenyth Allegra, MD 10/03/21 [phone]

## 2021-10-03 NOTE — Discharge Instructions (Addendum)
Your blood sugar was elevated today.  In the past you have been on metformin and glipizide along with your insulin.  Its not clear if you should still be on oral medications.  You need to call your doctor in the morning to schedule a follow-up to go over and adjust your diabetes medications.

## 2021-10-22 IMAGING — DX DG CHEST 1V PORT
1 series · 1 of 1 positions shown · non-contrast
Comparison: None.

CLINICAL DATA: Anemia and weakness.

EXAM:
PORTABLE CHEST 1 VIEW

[chest ap]
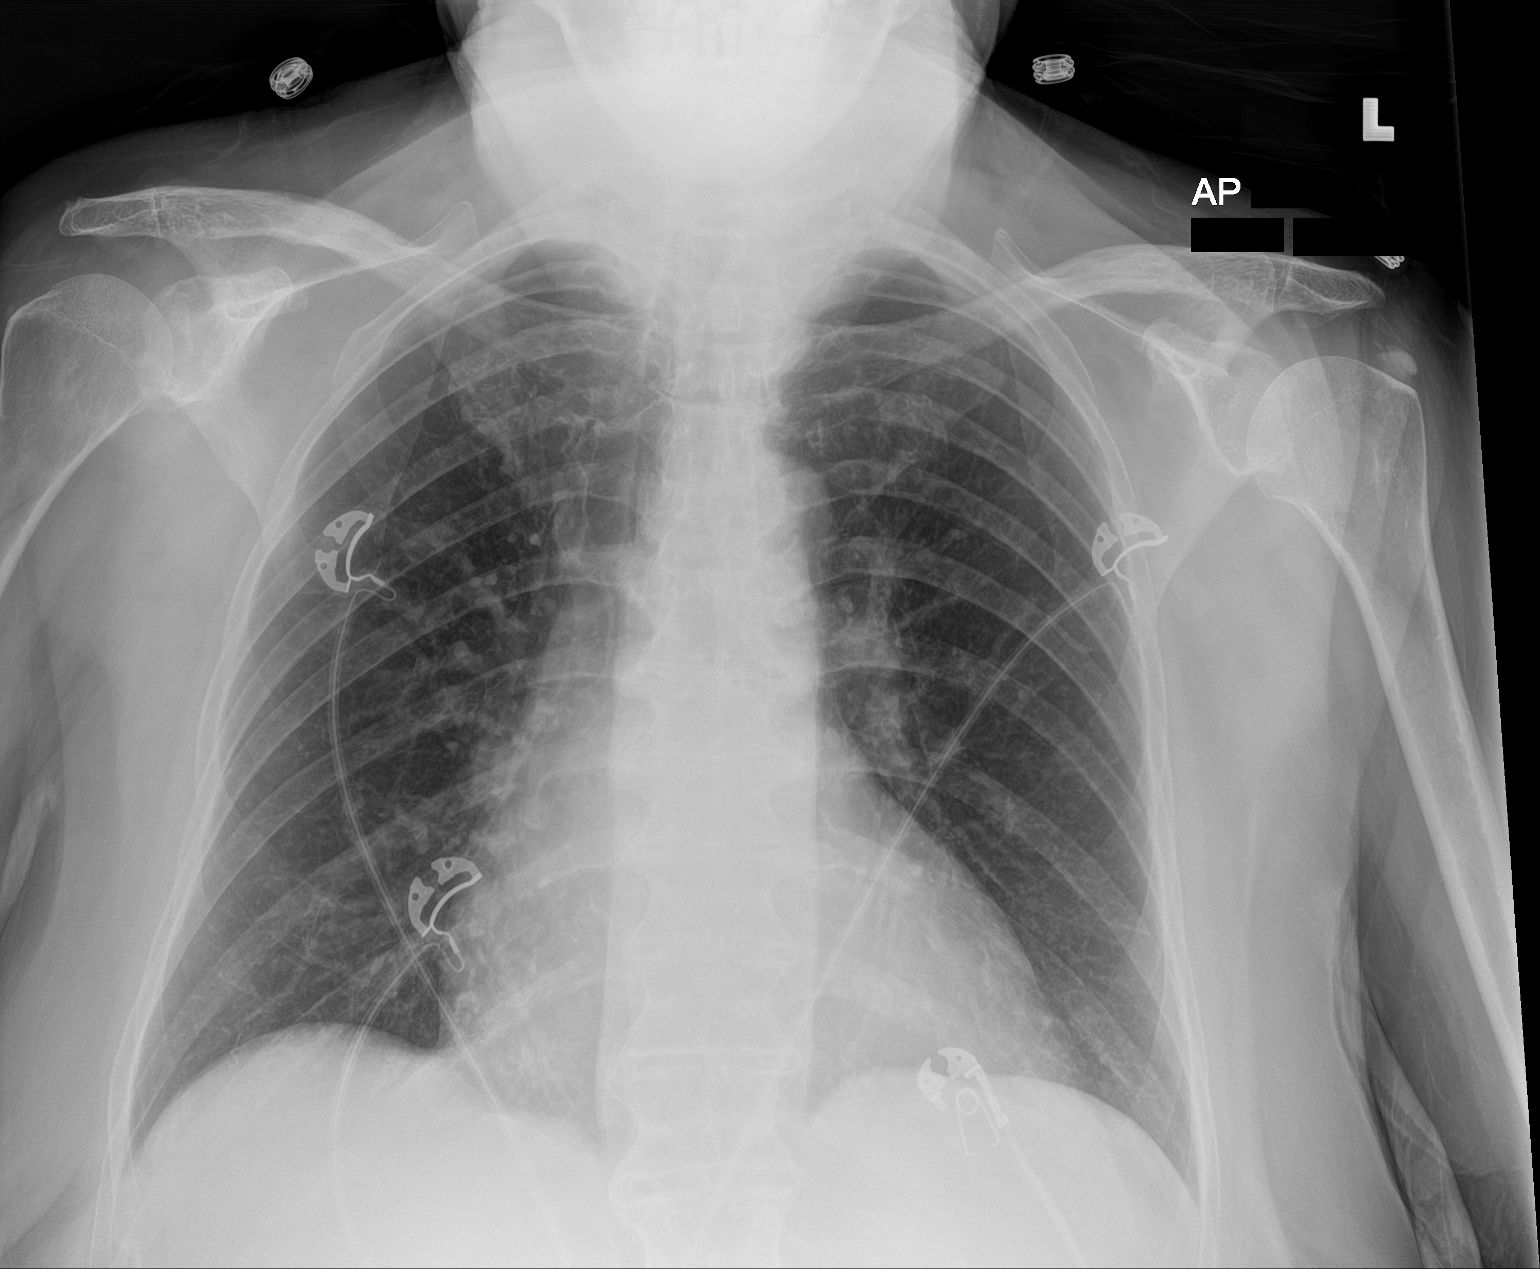

[1 of 1 positions shown; findings below may reference images not displayed]

FINDINGS: The heart size and mediastinal contours are within normal limits.
There is no evidence of pulmonary edema, consolidation,
pneumothorax, nodule or pleural fluid. The visualized skeletal
structures are unremarkable.
IMPRESSION: No active disease.

## 2021-12-26 NOTE — Progress Notes (Unsigned)
[facility]  PATIENT: Laura Winters DOB: [birth year]  REFERRING CLINICIAN: Margretta Sidle, MD HISTORY FROM: self, son Laura Winters REASON FOR VISIT: memory loss   HISTORICAL  CHIEF COMPLAINT:  Chief Complaint  Patient presents with   Memory Loss    RM 1 with son Laura Winters Pt is well, son states she been having issues with people, places, names, ADL. Has been having these concerns for a few yrs, has progressed over time.     HISTORY OF PRESENT ILLNESS:  The patient presents for evaluation of memory loss which has been present over the past few years. She does not personally notice much of a difference with her memory, but her son notes that her short term memory has been gradually declining. She struggles to remember the names of people, places, and objects. She will mix up her grandkids names. Repeats herself frequently. She previously took donepezil 5 mg daily, but stopped it due to diarrhea and nightmares.   She previously saw neurology last year for memory loss. Her last [place] in September 2022 was 13/30.  She has a history of right frontal meningioma which was found on [facility] in October 2021. She was recommended to repeat a CTH, but this was never done as she missed her last scheduled neurology appointment.  TBI:  No past history of TBI Stroke:  no past history of stroke Seizures:  no past history of seizures Sleep: She stays up until 4 am watching TV some nights. Will sometimes nap in the middle of the day. No history of OSA Mood: She has a history of depression but feels this has improved since moving closer to her family. Son has wanted her to try counseling but she has declined this. She takes Zoloft for her mood which she thinks helps.  Functional status:  Patient lives at an independent living facility Cooking: no issues, cooks for herself and is very careful with the burner Shopping: son does grocery shopping Driving: Stopped driving when she moved to [place]  because son was concerned about her getting lost and struggling to follow directions. Bills: She pays her rent, son helps with grocery bills and medical bills Medications: Sometimes forgets whether she took her insulin at night Ever left the stove on by accident?: no Forgetting loved ones names?: yes   OTHER MEDICAL CONDITIONS: DM 2, HTN, meningioma, smoking, HLD   REVIEW OF SYSTEMS: Full 14 system review of systems performed and negative with exception of: memory loss  ALLERGIES: Allergies  Allergen Reactions   Penicillins Swelling and Rash    Did it involve swelling of the face/tongue/throat, SOB, or low BP? Yes Did it involve sudden or severe rash/hives, skin peeling, or any reaction on the inside of your mouth or nose? Yes Did you need to seek medical attention at a hospital or doctor's office? Yes When did it last happen? Childhood If all above answers are "NO", may proceed with cephalosporin use.     HOME MEDICATIONS: Outpatient Medications Prior to Visit  Medication Sig Dispense Refill   amLODipine (NORVASC) 5 MG tablet SMARTSIG:1 Tablet(s) By Mouth Every Evening     Cholecalciferol (VITAMIN D3 PO) Take 1 tablet by mouth daily.     Cyanocobalamin (VITAMIN B12 PO) Take 2 tablets by mouth daily.     desoximetasone (TOPICORT) 0.25 % cream Apply to affected area qd 100 g 5   ferrous sulfate 325 (65 FE) MG tablet Take 1 tablet (325 mg total) by mouth daily. 30 tablet 3  glipiZIDE (GLUCOTROL) 10 MG tablet Take 10 mg by mouth 2 (two) times daily.     Insulin Glargine (BASAGLAR KWIKPEN) 100 UNIT/ML SMARTSIG:15 Unit(s) SUB-Q Every Night     lisinopril (ZESTRIL) 20 MG tablet Take 20 mg by mouth daily.     Na Sulfate-K Sulfate-Mg Sulf 17.5-3.13-1.6 GM/177ML SOLN Suprep (no substitutions)-TAKE AS DIRECTED. Lot [id number], exp 10/22 354 mL 0   omeprazole (PRILOSEC) 20 MG capsule TAKE 1 CAPSULE BY MOUTH ONCE DAILY 30 capsule 0   sertraline (ZOLOFT) 100 MG tablet Take 100 mg by mouth  daily.     simvastatin (ZOCOR) 40 MG tablet Take 40 mg by mouth at bedtime.     amLODipine (NORVASC) 2.5 MG tablet SMARTSIG:1 Tablet(s) By Mouth Every Evening (Patient not taking: Reported on 12/27/2021)     donepezil (ARICEPT) 5 MG tablet SMARTSIG:1 Tablet(s) By Mouth Every Evening (Patient not taking: Reported on 12/27/2021)     [place] 300 MG/2ML SOPN Inject into the skin. (Patient not taking: Reported on 12/27/2021)     metFORMIN (GLUCOPHAGE) 1000 MG tablet Take 2,000 mg by mouth 2 (two) times daily.  (Patient not taking: Reported on 12/27/2021)     triamcinolone cream (KENALOG) 0.1 % APPLY SPARINGLY TO AFFECTED AREA TWICE A DAY (Patient not taking: Reported on 12/27/2021)     Facility-Administered Medications Prior to Visit  Medication Dose Route Frequency Provider Last Rate Last Admin   0.9 %  sodium chloride infusion  500 mL Intravenous Once Laura Shipper, MD        PAST MEDICAL HISTORY: Past Medical History:  Diagnosis Date   Anemia    Depression    DM (diabetes mellitus) ([place])    type 2   Hyperlipidemia    Uterine cancer [facility]) age 63's    PAST SURGICAL HISTORY: Past Surgical History:  Procedure Laterality Date   ABDOMINAL HYSTERECTOMY     total , due to uterine cancer   APPENDECTOMY     COLONOSCOPY  8 years ago   in [place]-normal exam   GALLBLADDER SURGERY      FAMILY HISTORY: Family History  Problem Relation Age of Onset   Goiter Mother    Skin cancer Brother    Colon cancer Neg Hx    Esophageal cancer Neg Hx    Rectal cancer Neg Hx    Stomach cancer Neg Hx    Colon polyps Neg Hx     SOCIAL HISTORY: Social History   Socioeconomic History   Marital status: Widowed    Spouse name: Not on file   Number of children: 1   Years of education: Not on file   Highest education level: Not on file  Occupational History   Occupation: retired  Tobacco Use   Smoking status: Some Days    Types: Cigarettes   Smokeless tobacco: Never   Tobacco comments:     tobacco info given  Vaping Use   Vaping Use: Never used  Substance and Sexual Activity   Alcohol use: Yes    Comment: occ-once a month   Drug use: Yes    Frequency: 1.0 times per week    Types: Marijuana   Sexual activity: Not on file  Other Topics Concern   Not on file  Social History Narrative   Left handed   3 story home   Drinks caffeine   Social Determinants of Health   Financial Resource Strain: Not on file  Food Insecurity: Not on file  Transportation Needs: Not on file  Physical Activity: Not on file  Stress: Not on file  Social Connections: Not on file  Intimate Partner Violence: Not on file     PHYSICAL EXAM   GENERAL EXAM/CONSTITUTIONAL: Vitals:  Vitals:   12/27/21 1129  BP: 134/70  Pulse: 64  Weight: 140 lb (63.5 kg)  Height: 5' (1.524 m)   Body mass index is 27.34 kg/m. Wt Readings from Last 3 Encounters:  12/27/21 140 lb (63.5 kg)  10/02/21 132 lb (59.9 kg)  03/08/20 126 lb (57.2 kg)    NEUROLOGIC: MENTAL STATUS:     12/27/2021   12:11 PM  Montreal Cognitive Assessment   Visuospatial/ Executive (0/5) 3  Naming (0/3) 3  Attention: Read list of digits (0/2) 2  Attention: Read list of letters (0/1) 0  Attention: Serial 7 subtraction starting at 100 (0/3) 1  Language: Repeat phrase (0/2) 1  Language : Fluency (0/1) 1  Abstraction (0/2) 0  Delayed Recall (0/5) 3  Orientation (0/6) 6  Total 20     CRANIAL NERVE:  2nd, 3rd, 4th, 6th - pupils equal and reactive to light, visual fields full to confrontation, extraocular muscles intact, no nystagmus 5th - facial sensation symmetric 7th - facial strength symmetric 8th - hearing intact 9th - palate elevates symmetrically, uvula midline 11th - shoulder shrug symmetric 12th - tongue protrusion midline  MOTOR:  normal bulk and tone, no cogwheeling, full strength in the BUE, BLE  SENSORY:  normal and symmetric to light touch all 4 extremities  COORDINATION:  finger-nose-finger,  heel-to-shin normal, no tremor  REFLEXES:  deep tendon reflexes present and symmetric  GAIT/STATION:  normal     DIAGNOSTIC DATA (LABS, IMAGING, TESTING) - I reviewed patient records, labs, notes, testing and imaging myself where available.  Lab Results  Component Value Date   WBC 8.4 10/02/2021   HGB 11.7 (L) 10/02/2021   HCT 34.9 (L) 10/02/2021   MCV 94.3 10/02/2021   PLT 181 10/02/2021      Component Value Date/Time   NA 135 10/02/2021 1754   K 5.0 10/02/2021 1754   CL 105 10/02/2021 1754   CO2 23 10/02/2021 1754   GLUCOSE 426 (H) 10/02/2021 1754   BUN 30 (H) 10/02/2021 1754   CREATININE 1.49 (H) 10/02/2021 1754   CALCIUM 9.6 10/02/2021 1754   PROT 7.3 10/02/2021 1754   ALBUMIN 4.1 10/02/2021 1754   AST 17 10/02/2021 1754   ALT 15 10/02/2021 1754   ALKPHOS 106 10/02/2021 1754   BILITOT 0.4 10/02/2021 1754   GFRNONAA 35 (L) 10/02/2021 1754   GFRAA 34 (L) 01/31/2019 0043   No results found for: "CHOL", "HDL", "LDLCALC", "LDLDIRECT", "TRIG", "CHOLHDL" Lab Results  Component Value Date   HGBA1C 16.7 (H) 01/30/2019   Lab Results  Component Value Date   VITAMINB12 3,244 (H) 01/30/2019   Lab Results  Component Value Date   TSH 2.276 11/09/2019    TSH 08/07/21 wnl   ASSESSMENT AND PLAN  81 y.o. year old female with a history of DM 2, HTN, meningioma, smoking, HLD who presents for transfer of care for dementia and meningioma. MOCA score today is 20/30, improved from her prior score of 13/30 though son has noticed a continued decline in her memory and ability to perform ADLs. She was unable to tolerate donepezil due to diarrhea and nightmares. Will start Namenda for her memory. Will order brain MRI for meningioma surveillance.   1. Memory loss   2. Meningioma ([place])  PLAN: - MRI brain - Start Namenda 5 mg daily x 1 week, then increase to 5 mg BID (would avoid increasing further for now due to impaired kidney function)   Orders Placed This  Encounter  Procedures   MR BRAIN W WO CONTRAST    Meds ordered this encounter  Medications   memantine (NAMENDA) 5 MG tablet    Sig: Take 1 pill daily for 1 week, then increase to 1 pill twice a day    Dispense:  60 tablet    Refill:  6    Return in about 6 months (around 06/28/2022).  I spent an average of 60 minutes chart reviewing and counseling the patient, with at least 50% of the time face to face with the patient.   Genia Harold, MD 12/27/21 12:25 PM  [facility] [address] [place], [place] [address] [phone]

## 2021-12-27 ENCOUNTER — Encounter: Payer: Self-pay | Admitting: Psychiatry

## 2021-12-27 ENCOUNTER — Telehealth: Payer: Self-pay | Admitting: Psychiatry

## 2021-12-27 ENCOUNTER — Ambulatory Visit (INDEPENDENT_AMBULATORY_CARE_PROVIDER_SITE_OTHER): Payer: Medicare Other | Admitting: Psychiatry

## 2021-12-27 VITALS — BP 134/70 | HR 64 | Ht 60.0 in | Wt 140.0 lb

## 2021-12-27 DIAGNOSIS — R413 Other amnesia: Secondary | ICD-10-CM | POA: Diagnosis not present

## 2021-12-27 DIAGNOSIS — D329 Benign neoplasm of meninges, unspecified: Secondary | ICD-10-CM | POA: Diagnosis not present

## 2021-12-27 MED ORDER — MEMANTINE HCL 5 MG PO TABS: ORAL_TABLET | ORAL | 6 refills | Status: AC

## 2021-12-27 NOTE — Telephone Encounter (Signed)
Sent to GI for scheduling, Medicare A & B no auth needed.

## 2022-03-05 ENCOUNTER — Ambulatory Visit
Admission: RE | Admit: 2022-03-05 | Discharge: 2022-03-05 | Disposition: A | Discharge status: Home / Self Care | Payer: Medicare Other | Source: Ambulatory Visit | Attending: Psychiatry | Admitting: Psychiatry

## 2022-03-05 DIAGNOSIS — D329 Benign neoplasm of meninges, unspecified: Secondary | ICD-10-CM

## 2022-03-05 MED ORDER — GADOPICLENOL 0.5 MMOL/ML IV SOLN
6.0000 mL | Freq: Once | INTRAVENOUS | Status: AC | PRN
  Administered 2022-03-05: 6 mL via INTRAVENOUS

## 2022-03-12 ENCOUNTER — Telehealth: Payer: Self-pay | Admitting: Psychiatry

## 2022-03-12 NOTE — Telephone Encounter (Signed)
Dr. Billey Gosling sent mychart about MRI brain results on 03/06/22:  "Hi Laura Winters, Your meningioma looks stable on the MRI, with no changes from your CT scan in 2021.   There is some mild shrinkage of the brain (cortical atrophy), which can be associated with memory issues. The MRI also shows "chronic microvascular ischemic changes", which are essentially age spots on the brain that accumulate as we get older. They are more common with conditions like high blood pressure, high cholesterol, and diabetes. These can contribute to memory problems. You can help prevent more spots from accumulating by keeping blood pressure and blood sugar well-controlled.   For now I recommend continuing your current medications to help with your memory.   Thanks, Dr. Billey Gosling"

## 2022-03-12 NOTE — Telephone Encounter (Signed)
Pt is preferring to be called with test results, she does not understand how to access her my chart, please call.

## 2022-03-12 NOTE — Telephone Encounter (Signed)
Called and spoke with pt about results per Dr. Georgina Peer note. Pt verbalized understanding and appreciation.

## 2022-07-05 NOTE — Progress Notes (Deleted)
No chief complaint on file.   HISTORY OF PRESENT ILLNESS:  07/05/22 ALL:  Laura Winters is a 82 y.o. female here today for follow up for memory loss. She was seen in consult with Dr Delena Bali 12/2021. MRI 02/2022 showed cortical atrophy and microvascular disease. Right frontal lobe meningioma stable from previous CT in 2021. MOCA was 20/30. She was unable to tolerate donepezil due to diarrhea and nightmares. Memantine was started.    HISTORY (copied from Dr Quentin Mulling previous note)  The patient presents for evaluation of memory loss which has been present over the past few years. She does not personally notice much of a difference with her memory, but her son notes that her short term memory has been gradually declining. She struggles to remember the names of people, places, and objects. She will mix up her grandkids names. Repeats herself frequently. She previously took donepezil 5 mg daily, but stopped it due to diarrhea and nightmares.    She previously saw neurology last year for memory loss. Her last MOCA in September 2022 was 13/30.   She has a history of right frontal meningioma which was found on [facility] in October 2021. She was recommended to repeat a CTH, but this was never done as she missed her last scheduled neurology appointment.   TBI:  No past history of TBI Stroke:  no past history of stroke Seizures:  no past history of seizures Sleep: She stays up until 4 am watching TV some nights. Will sometimes nap in the middle of the day. No history of OSA Mood: She has a history of depression but feels this has improved since moving closer to her family. Son has wanted her to try counseling but she has declined this. She takes Zoloft for her mood which she thinks helps.   Functional status:  Patient lives at an independent living facility Cooking: no issues, cooks for herself and is very careful with the burner Shopping: son does grocery shopping Driving: Stopped driving when she moved  to [place] because son was concerned about her getting lost and struggling to follow directions. Bills: She pays her rent, son helps with grocery bills and medical bills Medications: Sometimes forgets whether she took her insulin at night Ever left the stove on by accident?: no Forgetting loved ones names?: yes     OTHER MEDICAL CONDITIONS: DM 2, HTN, meningioma, smoking, HLD   REVIEW OF SYSTEMS: Out of a complete 14 system review of symptoms, the patient complains only of the following symptoms, and all other reviewed systems are negative.   ALLERGIES: Allergies  Allergen Reactions   Penicillins Swelling and Rash    Did it involve swelling of the face/tongue/throat, SOB, or low BP? Yes Did it involve sudden or severe rash/hives, skin peeling, or any reaction on the inside of your mouth or nose? Yes Did you need to seek medical attention at a hospital or doctor's office? Yes When did it last happen? Childhood If all above answers are "NO", may proceed with cephalosporin use.      HOME MEDICATIONS: Outpatient Medications Prior to Visit  Medication Sig Dispense Refill   amLODipine (NORVASC) 5 MG tablet SMARTSIG:1 Tablet(s) By Mouth Every Evening     Cholecalciferol (VITAMIN D3 PO) Take 1 tablet by mouth daily.     Cyanocobalamin (VITAMIN B12 PO) Take 2 tablets by mouth daily.     desoximetasone (TOPICORT) 0.25 % cream Apply to affected area qd 100 g 5   ferrous sulfate 325 (  65 FE) MG tablet Take 1 tablet (325 mg total) by mouth daily. 30 tablet 3   glipiZIDE (GLUCOTROL) 10 MG tablet Take 10 mg by mouth 2 (two) times daily.     Insulin Glargine (BASAGLAR KWIKPEN) 100 UNIT/ML SMARTSIG:15 Unit(s) SUB-Q Every Night     lisinopril (ZESTRIL) 20 MG tablet Take 20 mg by mouth daily.     memantine (NAMENDA) 5 MG tablet Take 1 pill daily for 1 week, then increase to 1 pill twice a day 60 tablet 6   Na Sulfate-K Sulfate-Mg Sulf 17.5-3.13-1.6 GM/177ML SOLN Suprep (no substitutions)-TAKE AS  DIRECTED. Lot [id number], exp 10/22 354 mL 0   omeprazole (PRILOSEC) 20 MG capsule TAKE 1 CAPSULE BY MOUTH ONCE DAILY 30 capsule 0   sertraline (ZOLOFT) 100 MG tablet Take 100 mg by mouth daily.     simvastatin (ZOCOR) 40 MG tablet Take 40 mg by mouth at bedtime.     Facility-Administered Medications Prior to Visit  Medication Dose Route Frequency Provider Last Rate Last Admin   0.9 %  sodium chloride infusion  500 mL Intravenous Once Hilarie Fredrickson, MD         PAST MEDICAL HISTORY: Past Medical History:  Diagnosis Date   Anemia    Depression    DM (diabetes mellitus) (HCC)    type 2   Hyperlipidemia    Uterine cancer [facility]) age 63's     PAST SURGICAL HISTORY: Past Surgical History:  Procedure Laterality Date   ABDOMINAL HYSTERECTOMY     total , due to uterine cancer   APPENDECTOMY     COLONOSCOPY  8 years ago   in [place]-normal exam   GALLBLADDER SURGERY       FAMILY HISTORY: Family History  Problem Relation Age of Onset   Goiter Mother    Skin cancer Brother    Colon cancer Neg Hx    Esophageal cancer Neg Hx    Rectal cancer Neg Hx    Stomach cancer Neg Hx    Colon polyps Neg Hx      SOCIAL HISTORY: Social History   Socioeconomic History   Marital status: Widowed    Spouse name: Not on file   Number of children: 1   Years of education: Not on file   Highest education level: Not on file  Occupational History   Occupation: retired  Tobacco Use   Smoking status: Some Days    Types: Cigarettes   Smokeless tobacco: Never   Tobacco comments:    tobacco info given  Vaping Use   Vaping Use: Never used  Substance and Sexual Activity   Alcohol use: Yes    Comment: occ-once a month   Drug use: Yes    Frequency: 1.0 times per week    Types: Marijuana   Sexual activity: Not on file  Other Topics Concern   Not on file  Social History Narrative   Left handed   3 story home   Drinks caffeine   Social Determinants of Health   Financial Resource  Strain: Not on file  Food Insecurity: Not on file  Transportation Needs: Not on file  Physical Activity: Not on file  Stress: Not on file  Social Connections: Not on file  Intimate Partner Violence: Not on file     PHYSICAL EXAM  There were no vitals filed for this visit. There is no height or weight on file to calculate BMI.  Generalized: Well developed, in no acute distress  Cardiology: normal rate  and rhythm, no murmur auscultated  Respiratory: clear to auscultation bilaterally    Neurological examination  Mentation: Alert oriented to time, place, history taking. Follows all commands speech and language fluent Cranial nerve II-XII: Pupils were equal round reactive to light. Extraocular movements were full, visual field were full on confrontational test. Facial sensation and strength were normal. Uvula tongue midline. Head turning and shoulder shrug  were normal and symmetric. Motor: The motor testing reveals 5 over 5 strength of all 4 extremities. Good symmetric motor tone is noted throughout.  Sensory: Sensory testing is intact to soft touch on all 4 extremities. No evidence of extinction is noted.  Coordination: Cerebellar testing reveals good finger-nose-finger and heel-to-shin bilaterally.  Gait and station: Gait is normal. Tandem gait is normal. Romberg is negative. No drift is seen.  Reflexes: Deep tendon reflexes are symmetric and normal bilaterally.    DIAGNOSTIC DATA (LABS, IMAGING, TESTING) - I reviewed patient records, labs, notes, testing and imaging myself where available.  Lab Results  Component Value Date   WBC 8.4 10/02/2021   HGB 11.7 (L) 10/02/2021   HCT 34.9 (L) 10/02/2021   MCV 94.3 10/02/2021   PLT 181 10/02/2021      Component Value Date/Time   NA 135 10/02/2021 1754   K 5.0 10/02/2021 1754   CL 105 10/02/2021 1754   CO2 23 10/02/2021 1754   GLUCOSE 426 (H) 10/02/2021 1754   BUN 30 (H) 10/02/2021 1754   CREATININE 1.49 (H) 10/02/2021 1754    CALCIUM 9.6 10/02/2021 1754   PROT 7.3 10/02/2021 1754   ALBUMIN 4.1 10/02/2021 1754   AST 17 10/02/2021 1754   ALT 15 10/02/2021 1754   ALKPHOS 106 10/02/2021 1754   BILITOT 0.4 10/02/2021 1754   GFRNONAA 35 (L) 10/02/2021 1754   GFRAA 34 (L) 01/31/2019 0043   No results found for: "CHOL", "HDL", "LDLCALC", "LDLDIRECT", "TRIG", "CHOLHDL" Lab Results  Component Value Date   HGBA1C 16.7 (H) 01/30/2019   Lab Results  Component Value Date   VITAMINB12 3,244 (H) 01/30/2019   Lab Results  Component Value Date   TSH 2.276 11/09/2019        No data to display              12/27/2021   12:11 PM  Montreal Cognitive Assessment   Visuospatial/ Executive (0/5) 3  Naming (0/3) 3  Attention: Read list of digits (0/2) 2  Attention: Read list of letters (0/1) 0  Attention: Serial 7 subtraction starting at 100 (0/3) 1  Language: Repeat phrase (0/2) 1  Language : Fluency (0/1) 1  Abstraction (0/2) 0  Delayed Recall (0/5) 3  Orientation (0/6) 6  Total 20     ASSESSMENT AND PLAN  82 y.o. year old female  has a past medical history of Anemia, Depression, DM (diabetes mellitus) (HCC), Hyperlipidemia, and Uterine cancer (HCC) (age 56's). here with    No diagnosis found.  Theresia Lo ***.  Healthy lifestyle habits encouraged. *** will follow up with PCP as directed. *** will return to see me in ***, sooner if needed. *** verbalizes understanding and agreement with this plan.   No orders of the defined types were placed in this encounter.    No orders of the defined types were placed in this encounter.    Shawnie Dapper, MSN, FNP-C 07/05/2022, 3:37 PM  [facility] [address], [address] [place], [place] [address] [phone]

## 2022-07-09 ENCOUNTER — Ambulatory Visit: Payer: Medicare Other | Admitting: Family Medicine

## 2022-07-09 DIAGNOSIS — D329 Benign neoplasm of meninges, unspecified: Secondary | ICD-10-CM

## 2022-07-09 DIAGNOSIS — R413 Other amnesia: Secondary | ICD-10-CM

## 2022-08-01 IMAGING — CT CT HEAD W/O CM
4 series · 15 of 47 positions shown, 17 images · non-contrast
Comparison: No pertinent prior exams are available for comparison.

CLINICAL DATA: Head trauma, minor.  Dizziness, nonspecific.

EXAM:
CT HEAD WITHOUT CONTRAST
TECHNIQUE: Contiguous axial images were obtained from the base of the skull
through the vertex without intravenous contrast.

[Series 3: head wo · axial · 0.44mm/px · z∈[+1276,+1390]mm · 7 of 31 slices shown, 9 images]
[im 4/31  brain]
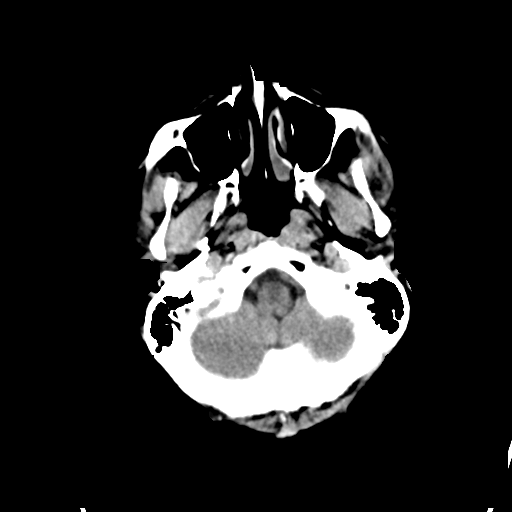
[im 4/31  bone]
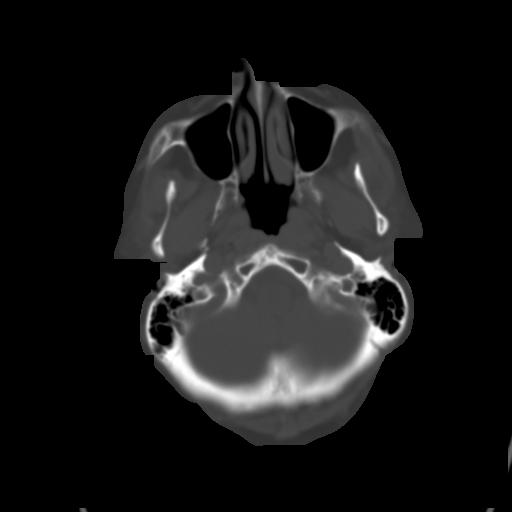
[im 8/31  brain]
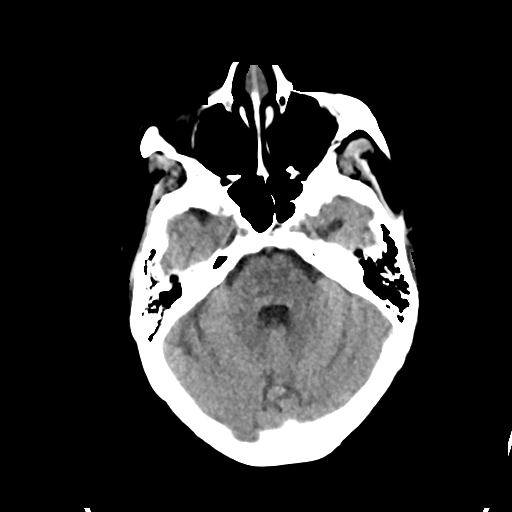
[im 12/31  brain]
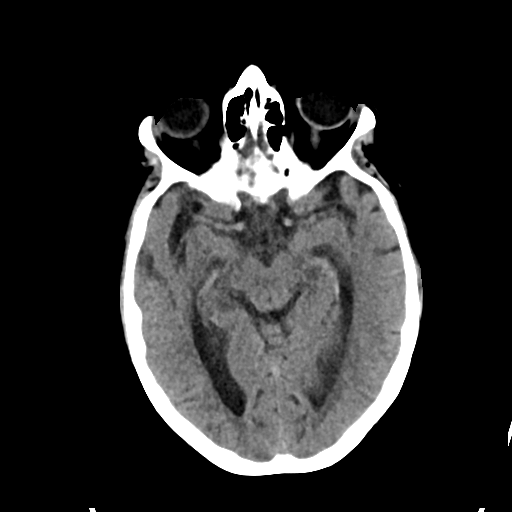
[im 16/31  brain]
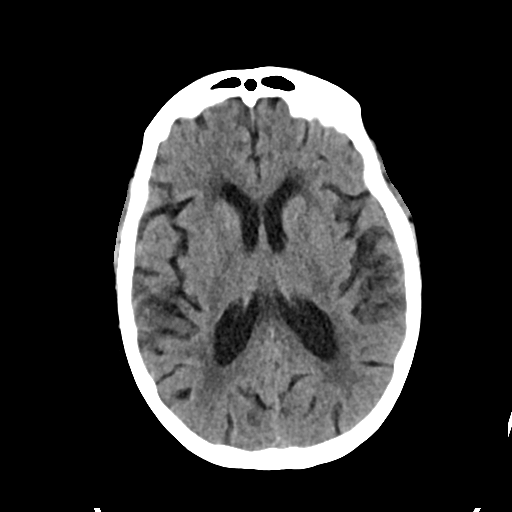
[im 19/31  brain]
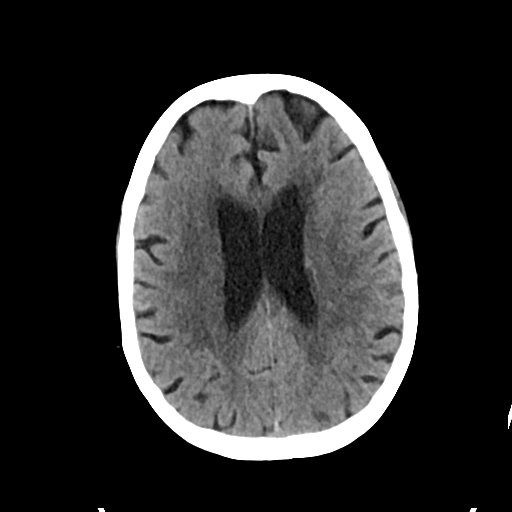
[im 19/31  bone]
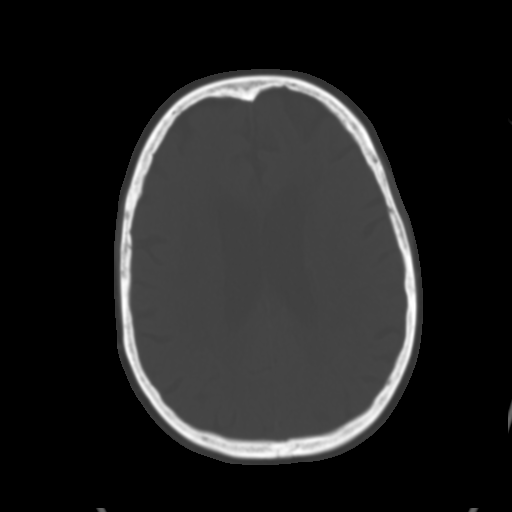
[im 23/31  brain]
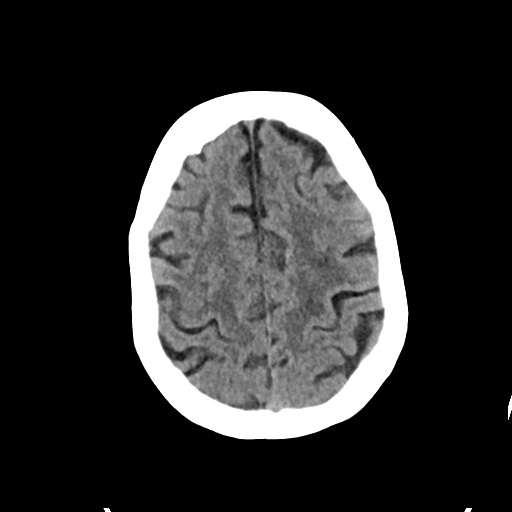
[im 27/31  brain]
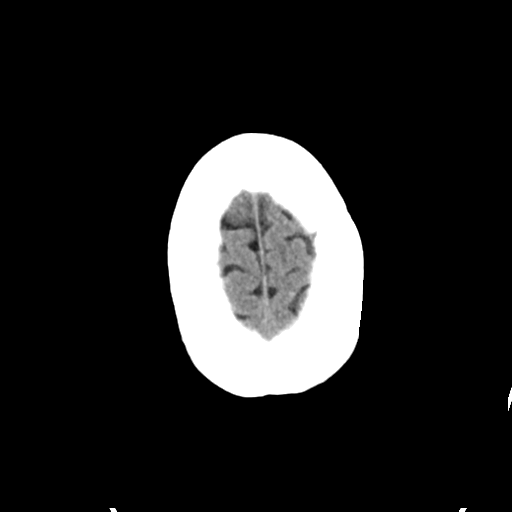

[Series 4: head bone · axial · 0.44mm/px · z∈[+1274,+1290]mm · 2 of 77 slices shown]
[im 8/77  bone]
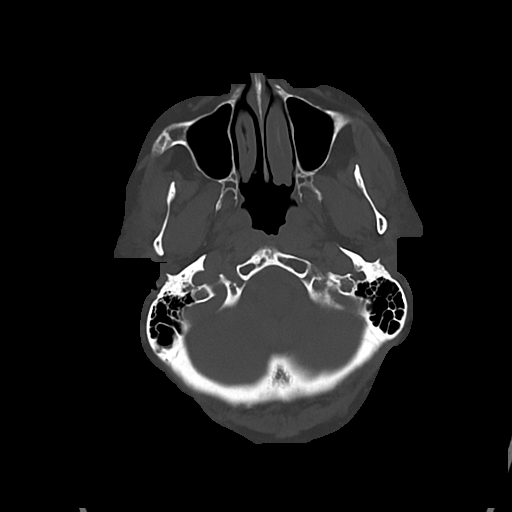
[im 16/77  bone]
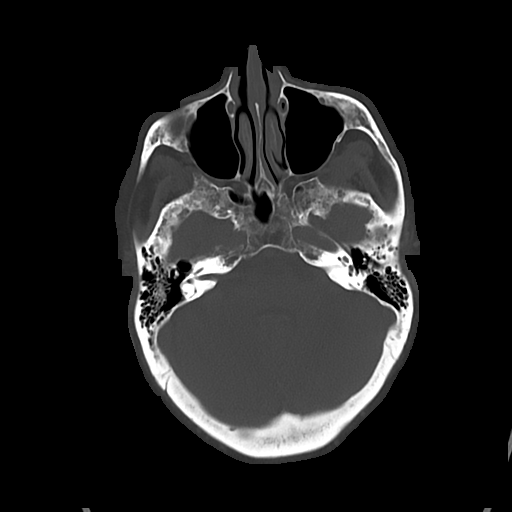

[Series 5: cor soft · coronal · 0.31mm/px · 3 of 67 slices shown]
[im 23/67  brain]
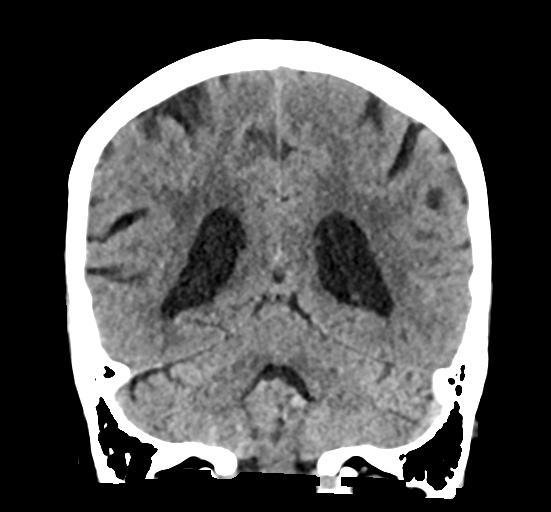
[im 30/67  brain]
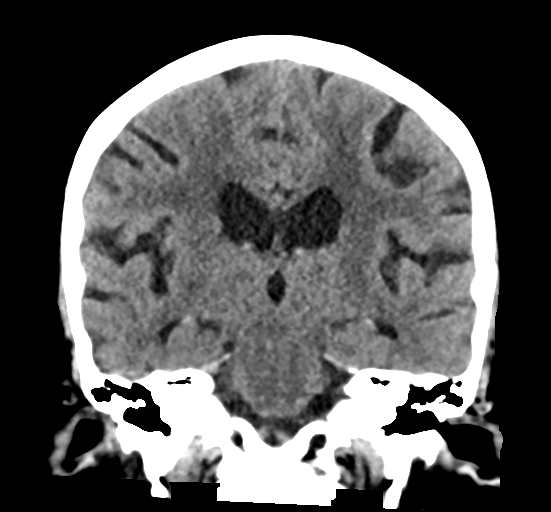
[im 37/67  brain]
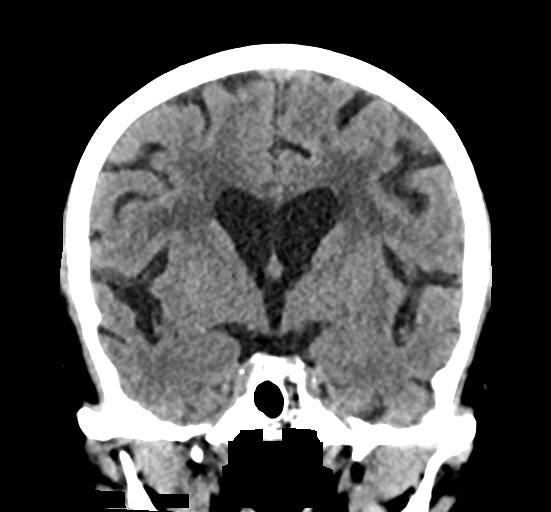

[Series 6: sag soft · sagittal · 0.30mm/px · 3 of 57 slices shown]
[im 19/57  brain]
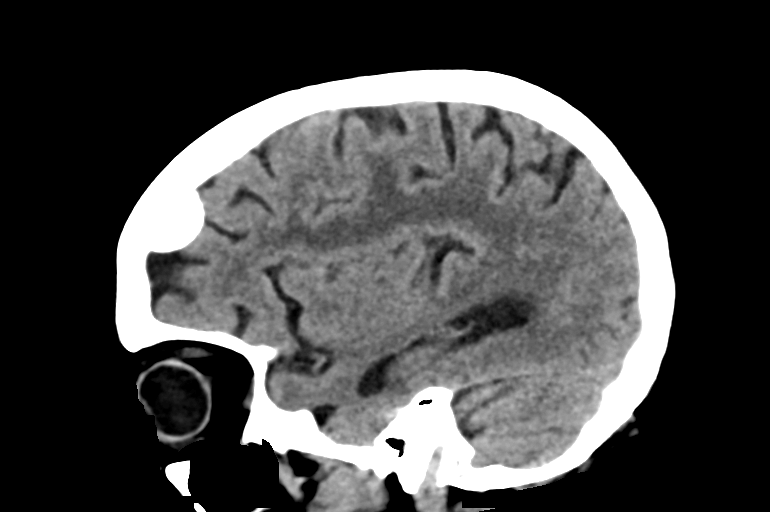
[im 29/57  brain]
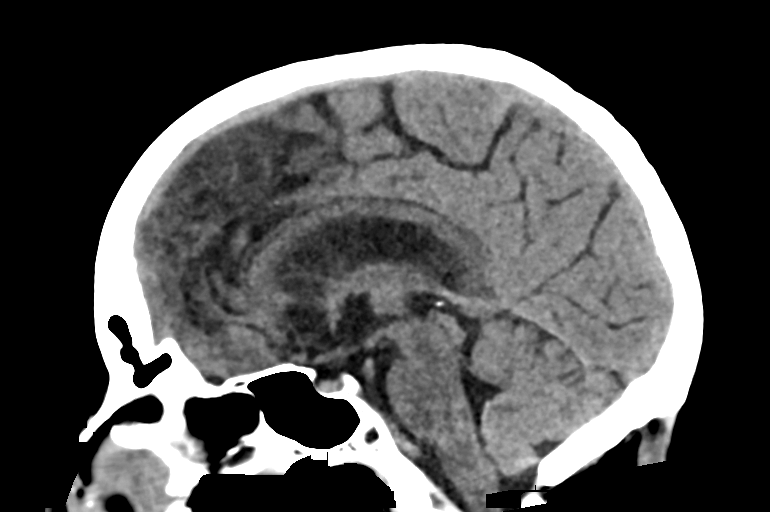
[im 38/57  brain]
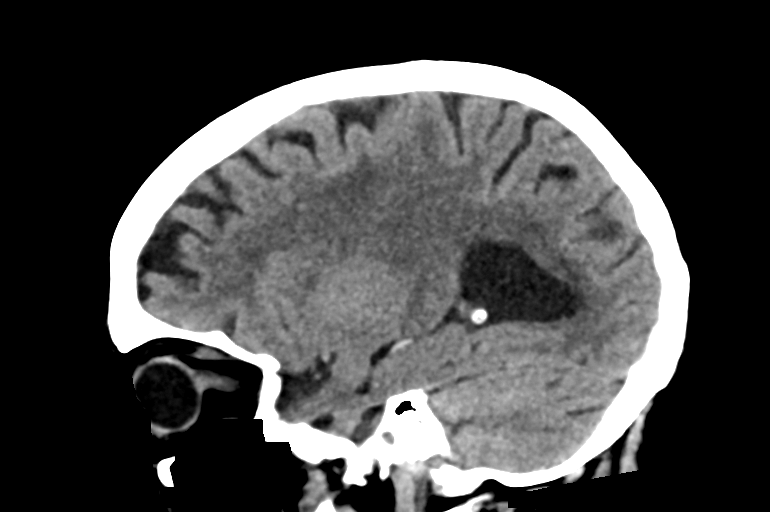

[15 of 47 positions shown; findings below may reference images not displayed]

FINDINGS: Brain:

Mild generalized cerebral atrophy.

Mild ill-defined hypoattenuation within the cerebral white matter is
nonspecific, but consistent with chronic small vessel ischemic
disease. Prominent perivascular space versus chronic lacunar infarct
within the right lentiform nucleus (series 3, image 14).

Rounded dural-based calcified focus overlying the right frontal lobe
measuring 1.9 x 1.5 cm in transaxial dimensions likely reflecting an
incidental meningioma (series 4, image 51). Mild mass effect upon
the underlying right frontal lobe.

There is no acute intracranial hemorrhage.

No demarcated cortical infarct.

No extra-axial fluid collection.

No midline shift.

Vascular: No hyperdense vessel.  Atherosclerotic calcifications.

Skull: Normal. Negative for fracture or focal lesion.

Sinuses/Orbits: Visualized orbits show no acute finding. Trace
ethmoid sinus mucosal thickening. No significant mastoid effusion at
the imaged levels.
IMPRESSION: No acute posttraumatic intracranial findings.

Incidentally noted 1.9 x 1.5 cm meningioma overlying the right
frontal lobe. Mild localized mass effect upon the underlying right
frontal lobe parenchyma. No midline shift.

Mild generalized cerebral atrophy and chronic small vessel ischemic
disease.

Prominent perivascular space versus chronic lacunar infarct within
the right basal ganglia.

## 2023-01-08 ENCOUNTER — Telehealth: Payer: Self-pay | Admitting: Psychiatry

## 2023-01-08 ENCOUNTER — Other Ambulatory Visit: Payer: Self-pay

## 2023-01-08 NOTE — Telephone Encounter (Signed)
[facility] [employer] requesting refill for memantine (NAMENDA) 5 MG tablet

## 2023-01-08 NOTE — Telephone Encounter (Signed)
Last seen on 12/27/21  Patient will need to schedule an office visit so we can refill Rx. She was to return in 6 months.   Phone room please call to get patient scheduled at next available slot.
